# Patient Record
Sex: Female | Born: 1990 | Hispanic: Yes | Marital: Single | Smoking: Former smoker
Health system: Southern US, Community
[De-identification: ages and names within clinical notes are randomized; demographics above are authoritative.]

## PROBLEM LIST (undated history)

## (undated) ENCOUNTER — Inpatient Hospital Stay (HOSPITAL_COMMUNITY): Payer: Self-pay

## (undated) DIAGNOSIS — Z6841 Body Mass Index (BMI) 40.0 and over, adult: Secondary | ICD-10-CM

## (undated) DIAGNOSIS — F419 Anxiety disorder, unspecified: Secondary | ICD-10-CM

## (undated) DIAGNOSIS — F32A Depression, unspecified: Secondary | ICD-10-CM

## (undated) DIAGNOSIS — Z789 Other specified health status: Secondary | ICD-10-CM

## (undated) HISTORY — DX: Anxiety disorder, unspecified: F41.9

## (undated) HISTORY — DX: Depression, unspecified: F32.A

## (undated) HISTORY — DX: Other specified health status: Z78.9

## (undated) HISTORY — DX: Body Mass Index (BMI) 40.0 and over, adult: Z684

## (undated) HISTORY — PX: NO PAST SURGERIES: SHX2092

---
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
## 2019-07-21 LAB — OB RESULTS CONSOLE RPR: RPR: NONREACTIVE

## 2019-07-21 LAB — OB RESULTS CONSOLE ANTIBODY SCREEN: Antibody Screen: NEGATIVE

## 2019-07-21 LAB — OB RESULTS CONSOLE GC/CHLAMYDIA
Chlamydia: NEGATIVE
Gonorrhea: NEGATIVE

## 2019-07-21 LAB — OB RESULTS CONSOLE HIV ANTIBODY (ROUTINE TESTING): HIV: NONREACTIVE

## 2019-07-21 LAB — OB RESULTS CONSOLE ABO/RH: RH Type: POSITIVE

## 2019-07-21 LAB — OB RESULTS CONSOLE RUBELLA ANTIBODY, IGM: Rubella: IMMUNE

## 2019-07-21 LAB — OB RESULTS CONSOLE HEPATITIS B SURFACE ANTIGEN: Hepatitis B Surface Ag: NEGATIVE

## 2019-07-22 ENCOUNTER — Other Ambulatory Visit (HOSPITAL_COMMUNITY): Payer: Self-pay | Admitting: Nurse Practitioner

## 2019-07-22 DIAGNOSIS — Z3A13 13 weeks gestation of pregnancy: Secondary | ICD-10-CM

## 2019-07-22 DIAGNOSIS — Z3682 Encounter for antenatal screening for nuchal translucency: Secondary | ICD-10-CM

## 2019-07-29 ENCOUNTER — Encounter (HOSPITAL_COMMUNITY): Payer: Self-pay | Admitting: *Deleted

## 2019-07-31 ENCOUNTER — Encounter (HOSPITAL_COMMUNITY): Payer: Self-pay

## 2019-07-31 ENCOUNTER — Ambulatory Visit (HOSPITAL_COMMUNITY): Payer: Medicaid Other | Admitting: *Deleted

## 2019-07-31 ENCOUNTER — Ambulatory Visit (HOSPITAL_COMMUNITY)
Admission: RE | Admit: 2019-07-31 | Discharge: 2019-07-31 | Disposition: A | Discharge status: Home / Self Care | Payer: Medicaid Other | Source: Ambulatory Visit | Attending: Obstetrics and Gynecology | Admitting: Obstetrics and Gynecology

## 2019-07-31 ENCOUNTER — Ambulatory Visit (HOSPITAL_COMMUNITY): Payer: Medicaid Other

## 2019-07-31 ENCOUNTER — Other Ambulatory Visit: Payer: Self-pay

## 2019-07-31 VITALS — BP 123/58 | HR 73 | Temp 97.9°F | Ht 64.5 in | Wt 212.5 lb

## 2019-07-31 DIAGNOSIS — O99211 Obesity complicating pregnancy, first trimester: Secondary | ICD-10-CM | POA: Insufficient documentation

## 2019-07-31 DIAGNOSIS — Z3682 Encounter for antenatal screening for nuchal translucency: Secondary | ICD-10-CM | POA: Insufficient documentation

## 2019-07-31 DIAGNOSIS — Z1379 Encounter for other screening for genetic and chromosomal anomalies: Secondary | ICD-10-CM | POA: Insufficient documentation

## 2019-07-31 DIAGNOSIS — Z3A13 13 weeks gestation of pregnancy: Secondary | ICD-10-CM | POA: Insufficient documentation

## 2019-08-02 LAB — FIRST TRIMESTER SCREEN W/NT
CRL: 74 mm
DIA MoM: 0.69
DIA Value: 114.4 pg/mL
Gest Age-Collect: 13.3 weeks
Maternal Age At EDD: 27.2 yr
Nuchal Translucency MoM: 0.82
Nuchal Translucency: 1.7 mm
Number of Fetuses: 1
PAPP-A MoM: 3.19
PAPP-A Value: 2562.5 ng/mL
Test Results:: NEGATIVE
Weight: 212 [lb_av]
hCG MoM: 0.6
hCG Value: 39.4 IU/mL

## 2019-08-04 ENCOUNTER — Telehealth (HOSPITAL_COMMUNITY): Payer: Self-pay | Admitting: Genetic Counselor

## 2019-08-04 NOTE — Telephone Encounter (Signed)
I called Ms. Melanie Ball to discuss her negative first trimester screen results. We reviewed that the risk for her pregnancy to be affected by Down syndrome decreased from her 1 in 870 age-related risk to less than 1 in 10,000, and the risk for trisomy 18 decreased from her 1 in 1703 age-related risk to less than 1 in 10,000 based on the results of this screen. While this screen significantly reduces the likelihood of the pregnancy being affected by trisomy 21 or trisomy 78, it cannot be considered diagnostic. Diagnostic testing via CVS or amniocentesis is available should she be interested in pursuing this. First trimester screening does not screen for open neural tube defects such as spina bifida, so it is recommended that MS-AFP screening be ordered around 16-18 weeks to screen for this. Ms. Melanie Ball confirmed that she had no questions about these results.  Buelah Manis, MS Genetic Counselor

## 2019-08-22 NOTE — L&D Delivery Note (Addendum)
OB/GYN Faculty Practice Delivery Note  Melanie Ball is a 29 y.o. G1P0 s/p SVD at [redacted]w[redacted]d. She was admitted for post-dates IOL.   ROM: 21h 67m with clear fluid GBS Status: Negative Maximum Maternal Temperature: 100.26F  Labor Progress: . Had Foley bulb placed and given 2 doses of Cytotec for cervical ripening.  Started on pitocin.  Had SROM with clear fluid.  After 11 hours on pitocin, was not making significant cervical change.  Therefore, pitocin stopped and given a third dose of Cytotec.  Restarted pitocin.  Progressed to complete without complication.  Shortly after she began pushing, had a fever of 100.4 and was given ampicillin and gentamicin for presumed triple-I.   Delivery Date/Time: 02/09/2020 at 1434 Delivery: Called to room and patient was complete and pushing. Head delivered LOA. No nuchal cord present. Shoulder and body delivered in usual fashion with terminal meconium noted. Infant with spontaneous cry, placed on mother's abdomen, dried and stimulated. Cord clamped x 2 after 1-minute delay, and cut by FOB under my direct supervision. Cord blood drawn. Placenta delivered spontaneously with gentle cord traction. Fundus firm with massage and Pitocin. Labia, perineum, vagina, and cervix were inspected, found to have a 1st degree perineal and right labial lacerations, which were repaired with 2-0 and 3-0 Vicryl Rapide in the usual fashion with hemostasis noted.   Placenta: Intact, 3 vessel cord Complications: Fever 100.4, given ampicillin/gentamicin for presumed triple-I Lacerations: 1st degree perineal and right labial EBL: 350 cc Analgesia: Epidural   Infant: Viable female  APGARs 8/9  per medical record  EMILY Genene Churn, MD PGY-2 Resident Family Medicine 02/09/2020, 3:14 PM   OB FELLOW DELIVERY ATTESTATION  I was gloved and present for the delivery in its entirety, and I agree with the above resident's note.    Jerilynn Birkenhead, MD [facility] Family Medicine Fellow, [facility] for [employer], [facility]

## 2019-09-12 ENCOUNTER — Encounter: Payer: Self-pay | Admitting: Medical

## 2019-09-12 DIAGNOSIS — U071 COVID-19: Secondary | ICD-10-CM | POA: Insufficient documentation

## 2020-01-06 LAB — OB RESULTS CONSOLE GC/CHLAMYDIA
Chlamydia: NEGATIVE
Gonorrhea: NEGATIVE

## 2020-01-06 LAB — OB RESULTS CONSOLE GBS: GBS: NEGATIVE

## 2020-02-02 ENCOUNTER — Other Ambulatory Visit (HOSPITAL_COMMUNITY): Payer: Self-pay | Admitting: Advanced Practice Midwife

## 2020-02-02 ENCOUNTER — Encounter (HOSPITAL_COMMUNITY): Payer: Self-pay | Admitting: *Deleted

## 2020-02-02 ENCOUNTER — Other Ambulatory Visit: Payer: Self-pay | Admitting: Family Medicine

## 2020-02-02 ENCOUNTER — Telehealth (HOSPITAL_COMMUNITY): Payer: Self-pay | Admitting: *Deleted

## 2020-02-02 NOTE — Telephone Encounter (Signed)
Preadmission screen  

## 2020-02-02 NOTE — Progress Notes (Signed)
Induction orders placed  Marlowe Alt, DO OB Fellow, Faculty Practice 02/02/2020 4:21 PM

## 2020-02-06 ENCOUNTER — Other Ambulatory Visit (HOSPITAL_COMMUNITY): Payer: Medicaid Other | Attending: Family Medicine

## 2020-02-08 ENCOUNTER — Inpatient Hospital Stay (HOSPITAL_COMMUNITY): Payer: Medicaid Other | Admitting: Anesthesiology

## 2020-02-08 ENCOUNTER — Other Ambulatory Visit: Payer: Self-pay

## 2020-02-08 ENCOUNTER — Inpatient Hospital Stay (HOSPITAL_COMMUNITY)
Admission: RE | Admit: 2020-02-08 | Discharge: 2020-02-11 | DRG: 805 | Disposition: A | Discharge status: Home / Self Care | Payer: Medicaid Other | Attending: Obstetrics & Gynecology | Admitting: Obstetrics & Gynecology

## 2020-02-08 ENCOUNTER — Inpatient Hospital Stay (HOSPITAL_COMMUNITY): Payer: Medicaid Other

## 2020-02-08 ENCOUNTER — Encounter (HOSPITAL_COMMUNITY): Payer: Self-pay | Admitting: Obstetrics and Gynecology

## 2020-02-08 DIAGNOSIS — Z3A41 41 weeks gestation of pregnancy: Secondary | ICD-10-CM

## 2020-02-08 DIAGNOSIS — Z8616 Personal history of COVID-19: Secondary | ICD-10-CM

## 2020-02-08 DIAGNOSIS — O480 Post-term pregnancy: Secondary | ICD-10-CM | POA: Diagnosis present

## 2020-02-08 DIAGNOSIS — O411230 Chorioamnionitis, third trimester, not applicable or unspecified: Secondary | ICD-10-CM | POA: Diagnosis present

## 2020-02-08 DIAGNOSIS — O99210 Obesity complicating pregnancy, unspecified trimester: Secondary | ICD-10-CM

## 2020-02-08 DIAGNOSIS — Z20822 Contact with and (suspected) exposure to covid-19: Secondary | ICD-10-CM | POA: Diagnosis present

## 2020-02-08 DIAGNOSIS — O99214 Obesity complicating childbirth: Secondary | ICD-10-CM | POA: Diagnosis present

## 2020-02-08 DIAGNOSIS — E669 Obesity, unspecified: Secondary | ICD-10-CM

## 2020-02-08 DIAGNOSIS — Z87891 Personal history of nicotine dependence: Secondary | ICD-10-CM

## 2020-02-08 DIAGNOSIS — O411290 Chorioamnionitis, unspecified trimester, not applicable or unspecified: Secondary | ICD-10-CM

## 2020-02-08 DIAGNOSIS — Z3490 Encounter for supervision of normal pregnancy, unspecified, unspecified trimester: Secondary | ICD-10-CM | POA: Diagnosis present

## 2020-02-08 LAB — CBC
HCT: 37.2 % (ref 36.0–46.0)
Hemoglobin: 12.4 g/dL (ref 12.0–15.0)
MCH: 32.3 pg (ref 26.0–34.0)
MCHC: 33.3 g/dL (ref 30.0–36.0)
MCV: 96.9 fL (ref 80.0–100.0)
Platelets: 222 10*3/uL (ref 150–400)
RBC: 3.84 MIL/uL — ABNORMAL LOW (ref 3.87–5.11)
RDW: 13.8 % (ref 11.5–15.5)
WBC: 8.3 10*3/uL (ref 4.0–10.5)
nRBC: 0 % (ref 0.0–0.2)

## 2020-02-08 LAB — TYPE AND SCREEN
ABO/RH(D): O POS
Antibody Screen: NEGATIVE

## 2020-02-08 LAB — SARS CORONAVIRUS 2 BY RT PCR (HOSPITAL ORDER, PERFORMED IN ~~LOC~~ HOSPITAL LAB): SARS Coronavirus 2: NEGATIVE

## 2020-02-08 LAB — ABO/RH: ABO/RH(D): O POS

## 2020-02-08 LAB — RPR: RPR Ser Ql: NONREACTIVE

## 2020-02-08 MED ORDER — DIPHENHYDRAMINE HCL 50 MG/ML IJ SOLN
12.5000 mg | INTRAMUSCULAR | Status: DC | PRN
  Administered 2020-02-09: 12.5 mg via INTRAVENOUS
  Filled 2020-02-08: qty 1

## 2020-02-08 MED ORDER — FENTANYL CITRATE (PF) 100 MCG/2ML IJ SOLN
50.0000 ug | INTRAMUSCULAR | Status: DC | PRN
  Administered 2020-02-08 (×3): 100 ug via INTRAVENOUS
  Filled 2020-02-08 (×3): qty 2

## 2020-02-08 MED ORDER — TERBUTALINE SULFATE 1 MG/ML IJ SOLN: 0.2500 mg | Freq: Once | INTRAMUSCULAR | Status: DC | PRN

## 2020-02-08 MED ORDER — LACTATED RINGERS IV SOLN
500.0000 mL | Freq: Once | INTRAVENOUS | Status: AC
  Administered 2020-02-08: 500 mL via INTRAVENOUS

## 2020-02-08 MED ORDER — OXYTOCIN-SODIUM CHLORIDE 30-0.9 UT/500ML-% IV SOLN
1.0000 m[IU]/min | INTRAVENOUS | Status: DC
  Administered 2020-02-08: 2 m[IU]/min via INTRAVENOUS

## 2020-02-08 MED ORDER — OXYTOCIN BOLUS FROM INFUSION
500.0000 mL | Freq: Once | INTRAVENOUS | Status: AC
  Administered 2020-02-09: 333 mL via INTRAVENOUS

## 2020-02-08 MED ORDER — FENTANYL-BUPIVACAINE-NACL 0.5-0.125-0.9 MG/250ML-% EP SOLN
12.0000 mL/h | EPIDURAL | Status: DC | PRN
  Filled 2020-02-08: qty 250

## 2020-02-08 MED ORDER — FENTANYL-BUPIVACAINE-NACL 0.5-0.125-0.9 MG/250ML-% EP SOLN
12.0000 mL/h | EPIDURAL | Status: DC | PRN
Start: 2020-02-08 — End: 2020-02-08

## 2020-02-08 MED ORDER — LACTATED RINGERS IV SOLN: 500.0000 mL | Freq: Once | INTRAVENOUS | Status: DC

## 2020-02-08 MED ORDER — MISOPROSTOL 50MCG HALF TABLET
50.0000 ug | ORAL_TABLET | ORAL | Status: DC | PRN
  Administered 2020-02-08 (×3): 50 ug via ORAL
  Filled 2020-02-08 (×3): qty 1

## 2020-02-08 MED ORDER — PHENYLEPHRINE 40 MCG/ML (10ML) SYRINGE FOR IV PUSH (FOR BLOOD PRESSURE SUPPORT)
80.0000 ug | PREFILLED_SYRINGE | INTRAVENOUS | Status: DC | PRN
  Filled 2020-02-08: qty 10

## 2020-02-08 MED ORDER — EPHEDRINE 5 MG/ML INJ
10.0000 mg | INTRAVENOUS | Status: DC | PRN
  Filled 2020-02-08: qty 2

## 2020-02-08 MED ORDER — ONDANSETRON HCL 4 MG/2ML IJ SOLN
4.0000 mg | Freq: Four times a day (QID) | INTRAMUSCULAR | Status: DC | PRN
  Administered 2020-02-08: 4 mg via INTRAVENOUS
  Filled 2020-02-08: qty 2

## 2020-02-08 MED ORDER — SOD CITRATE-CITRIC ACID 500-334 MG/5ML PO SOLN: 30.0000 mL | ORAL | Status: DC | PRN

## 2020-02-08 MED ORDER — LIDOCAINE HCL (PF) 1 % IJ SOLN
INTRAMUSCULAR | Status: DC | PRN
  Administered 2020-02-08: 5 mL via EPIDURAL

## 2020-02-08 MED ORDER — EPHEDRINE 5 MG/ML INJ: 10.0000 mg | INTRAVENOUS | Status: DC | PRN

## 2020-02-08 MED ORDER — LACTATED RINGERS IV SOLN: INTRAVENOUS | Status: DC

## 2020-02-08 MED ORDER — OXYTOCIN-SODIUM CHLORIDE 30-0.9 UT/500ML-% IV SOLN
2.5000 [IU]/h | INTRAVENOUS | Status: DC
  Administered 2020-02-09: 2.5 [IU]/h via INTRAVENOUS
  Filled 2020-02-08 (×2): qty 500

## 2020-02-08 MED ORDER — PHENYLEPHRINE 40 MCG/ML (10ML) SYRINGE FOR IV PUSH (FOR BLOOD PRESSURE SUPPORT): 80.0000 ug | PREFILLED_SYRINGE | INTRAVENOUS | Status: DC | PRN

## 2020-02-08 MED ORDER — LACTATED RINGERS IV SOLN: 500.0000 mL | INTRAVENOUS | Status: DC | PRN

## 2020-02-08 MED ORDER — LIDOCAINE HCL (PF) 1 % IJ SOLN: 30.0000 mL | INTRAMUSCULAR | Status: DC | PRN

## 2020-02-08 MED ORDER — SODIUM CHLORIDE (PF) 0.9 % IJ SOLN
INTRAMUSCULAR | Status: DC | PRN
  Administered 2020-02-08: 12 mL/h via EPIDURAL

## 2020-02-08 MED ORDER — ACETAMINOPHEN 325 MG PO TABS
650.0000 mg | ORAL_TABLET | ORAL | Status: DC | PRN
  Administered 2020-02-09 (×2): 650 mg via ORAL
  Filled 2020-02-08 (×2): qty 2

## 2020-02-08 NOTE — Progress Notes (Signed)
Patient ID: Melanie Ball, female   DOB: [birth year], 29 y.o.   MRN: [id number]  Melanie Ball is a 29 y.o. G1P0 at [redacted]w[redacted]d admitted for IOL for post-dates.  Subjective: Feeling some contractions.  Agreeable to starting pitocin.  Objective: BP 121/67   Pulse 74   Temp 98 F (36.7 C) (Oral)   Resp 16   Ht 5\' 5"  (1.651 m)   Wt 113.2 kg   LMP 04/27/2019   BMI 41.53 kg/m  Total I/O In: 854 [I.V.:854] Out: -   FHT:  FHR: 125 bpm, variability: moderate,  accelerations:  Present,  decelerations:  Absent UC:   irregular, every 3-6 minutes  SVE:   Dilation: 3.5 Effacement (%): 80, 90 Station: -2 Exam by:: Dr. [email or web address]   Labs: Lab Results  Component Value Date   WBC 8.3 02/08/2020   HGB 12.4 02/08/2020   HCT 37.2 02/08/2020   MCV 96.9 02/08/2020   PLT 222 02/08/2020    Assessment / Plan: 29 y.o. G1P0 at [redacted]w[redacted]d here for IOL for post-dates.  Labor: S/p miso x2 and Foley bulb.  Start pitocin. Fetal Wellbeing:  Cat I Pain Control:  IV pain meds PRN, epidural upon request GBS: Negative Anticipated MOD:  SVD   Melanie Ball [redacted]w[redacted]d, MD PGY-2 Resident Family Medicine 02/08/2020, 10:54 AM

## 2020-02-08 NOTE — H&P (Signed)
LABOR AND DELIVERY ADMISSION HISTORY AND PHYSICAL NOTE  Melanie Ball is a 29 y.o. female G1P0 with IUP at [redacted]w[redacted]d by LMP c/w 13wk [place] presenting for post dates IOL.   She reports positive fetal movement. She denies leakage of fluid, vaginal bleeding, or contractions.   She plans on breast feeding. Her contraception plan is: condoms.  Prenatal History/Complications: PNC at [facility] [place]:  @[redacted]w[redacted]d , CWD, normal anatomy, cephalic presentation, posterior placenta, 59%ile, EFW 504g  Pregnancy complications:  - None  Past Medical History: Past Medical History:  Diagnosis Date  . Medical history non-contributory     Past Surgical History: Past Surgical History:  Procedure Laterality Date  . NO PAST SURGERIES      Obstetrical History: OB History    Gravida  1   Para      Term      Preterm      AB      Living  0     SAB      TAB      Ectopic      Multiple      Live Births              Social History: Social History   Socioeconomic History  . Marital status: Single    Spouse name: Not on file  . Number of children: Not on file  . Years of education: Not on file  . Highest education level: Not on file  Occupational History  . Not on file  Tobacco Use  . Smoking status: Former Smoker    Packs/day: 0.25    Types: Cigarettes    Quit date: 02/28/2019    Years since quitting: 0.9  . Smokeless tobacco: Never Used  Substance and Sexual Activity  . Alcohol use: Not Currently  . Drug use: Not Currently  . Sexual activity: Not on file  Other Topics Concern  . Not on file  Social History Narrative  . Not on file   Social Determinants of Health   Financial Resource Strain:   . Difficulty of Paying Living Expenses:   Food Insecurity:   . Worried About 05/01/2019 in the Last Year:   . Programme researcher, broadcasting/film/video in the Last Year:   Transportation Needs:   . Barista (Medical):   Freight forwarder Lack of Transportation (Non-Medical):   Physical Activity:    . Days of Exercise per Week:   . Minutes of Exercise per Session:   Stress:   . Feeling of Stress :   Social Connections:   . Frequency of Communication with Friends and Family:   . Frequency of Social Gatherings with Friends and Family:   . Attends Religious Services:   . Active Member of Clubs or Organizations:   . Attends [employer] Meetings:   Banker Marital Status:     Family History: No family history on file.  Allergies: No Known Allergies  Medications Prior to Admission  Medication Sig Dispense Refill Last Dose  . Prenatal Vit-Fe Fumarate-FA (PRENATAL VITAMIN PO) Take by mouth.        Review of Systems  All systems reviewed and negative except as stated in HPI  Physical Exam Weight 113.2 kg, last menstrual period 04/27/2019. General appearance: alert, oriented, NAD Lungs: normal respiratory effort Heart: regular rate Abdomen: soft, non-tender; gravid Extremities: No calf swelling or tenderness Presentation: cephalic by SVE  Fetal monitoringBaseline: 120 bpm, Variability: Good {> 6 bpm), Accelerations: Reactive and Decelerations: Absent Uterine activity:  irregular     Prenatal labs: ABO, Rh: O/Positive/-- (11/30 0000) Antibody: Negative (11/30 0000) Rubella: Immune (11/30 0000) RPR: Nonreactive (11/30 0000)  HBsAg: Negative (11/30 0000)  HIV: Non-reactive (11/30 0000)  GC/Chlamydia: neg/neg 01/06/2020  GBS: Negative/-- (05/18 0000)  1-hr GTT: abnormal 146, 3hr GTT normal (51,025,852,77) Genetic screening:  Normal first trimester screen Anatomy US: normal  Prenatal Transfer Tool  Maternal Diabetes: No Genetic Screening: Normal Maternal Ultrasounds/Referrals: Normal Fetal Ultrasounds or other Referrals:  None Maternal Substance Abuse:  No Significant Maternal Medications:  None Significant Maternal Lab Results: Group B Strep negative  No results found for this or any previous visit (from the past 24 hour(s)).  Patient Active Problem List    Diagnosis Date Noted  . Encounter for induction of labor 02/08/2020  . COVID-19 09/12/2019    Assessment: Melanie Ball is a 29 y.o. G1P0 at [redacted]w[redacted]d here for post dates IOL.  #Labor: start induction with misoprostol #Pain: IV pain meds PRN, epidural upon request #FWB: Cat I #GBS/ID: negative #COVID: prior positive on 09/11/2019, per protocol repeat obtained and is pending #MOF: Breast #MOC: Condoms #Circ: yes   Clarnce Flock 02/08/2020, 12:14 AM

## 2020-02-08 NOTE — Anesthesia Preprocedure Evaluation (Addendum)
Anesthesia Evaluation  Patient identified by MRN, date of birth, ID band Patient awake    Reviewed: Allergy & Precautions, Patient's Chart, lab work & pertinent test results  Airway Mallampati: II       Dental   Pulmonary former smoker,    Pulmonary exam normal        Cardiovascular negative cardio ROS Normal cardiovascular exam     Neuro/Psych negative neurological ROS  negative psych ROS   GI/Hepatic   Endo/Other    Renal/GU      Musculoskeletal   Abdominal (+) + obese,   Peds  Hematology   Anesthesia Other Findings   Reproductive/Obstetrics (+) Pregnancy                            Anesthesia Physical Anesthesia Plan  ASA: III  Anesthesia Plan: Epidural   Post-op Pain Management:    Induction:   PONV Risk Score and Plan: 0  Airway Management Planned: Natural Airway  Additional Equipment: None  Intra-op Plan:   Post-operative Plan:   Informed Consent: I have reviewed the patients History and Physical, chart, labs and discussed the procedure including the risks, benefits and alternatives for the proposed anesthesia with the patient or authorized representative who has indicated his/her understanding and acceptance.       Plan Discussed with:   Anesthesia Plan Comments: (Lab Results      Component                Value               Date                      WBC                      8.3                 02/08/2020                HGB                      12.4                02/08/2020                HCT                      37.2                02/08/2020                MCV                      96.9                02/08/2020                PLT                      222                 02/08/2020           )       Anesthesia Quick Evaluation

## 2020-02-08 NOTE — Progress Notes (Signed)
MVUs <40  IUPC re-zeroed and flushed multiple times.  Patient has been ~5 cm since 4 PM.  Will do pitocin break at this time. Give 2nd cytotec (cervix still firm on last exam)  Restart pit in 4 hours. FHR Cat I at this time  Marlowe Alt, DO OB Fellow, Faculty Practice 02/08/2020 11:59 PM

## 2020-02-08 NOTE — Progress Notes (Signed)
Melanie Ball is a 29 y.o. G1P0 at [redacted]w[redacted]d admitted for IOL for post-dates.  Subjective: Comfortable with epidural  Objective: BP 121/63   Pulse 81   Temp 99.1 F (37.3 C) (Oral)   Resp 15   Ht 5\' 5"  (1.651 m)   Wt 113.2 kg   LMP 04/27/2019   SpO2 100%   BMI 41.53 kg/m  No intake/output data recorded.  FHT:  FHR: 135 bpm, variability: moderate,  accelerations:  Present,  decelerations:  Absent UC:   Difficult to trace on TOCO  SVE:   Dilation: 5 Effacement (%): 90 Station: 0 Exam by:: [email or web address]  Pitocin @ 18 mu/min  Labs: Lab Results  Component Value Date   WBC 8.3 02/08/2020   HGB 12.4 02/08/2020   HCT 37.2 02/08/2020   MCV 96.9 02/08/2020   PLT 222 02/08/2020    Assessment / Plan: 29 y.o.G1P0 at [redacted]w[redacted]d here forIOL for post-dates.  Labor:S/p cyto x2 and Foley bulb.SROM with clear fluid. Pitocin started at 1051 this AM. IUPC placed at this check due to difficulty tracing contractions, will continue to titrate. Fetal Wellbeing:Cat I Pain Control:Epidural [redacted]w[redacted]d Anticipated MOD:SVD  XNA:[id number] DO OB Fellow, Faculty Practice 02/08/2020, 10:25 PM

## 2020-02-08 NOTE — Progress Notes (Signed)
LABOR PROGRESS NOTE  Melanie Ball is a 29 y.o. G1P0 at [redacted]w[redacted]d  admitted for PD IOL.  Subjective: Comfortable Feeling contractions a bit  Objective: BP 116/68   Pulse 67   Temp 98.2 F (36.8 C) (Oral)   Resp 16   Ht 5\' 5"  (1.651 m)   Wt 113.2 kg   LMP 04/27/2019   BMI 41.53 kg/m  or  Vitals:   02/08/20 0037 02/08/20 0146 02/08/20 0149 02/08/20 0512  BP: 125/69  123/75 116/68  Pulse: 88  82 67  Resp: 16  16 16   Temp: 98.2 F (36.8 C)   98.2 F (36.8 C)  TempSrc: Oral   Oral  Weight:      Height:  5\' 5"  (1.651 m)       Dilation: 1 Effacement (%): 70 Station: -2 Presentation: Vertex Exam by:: Dr FHT: baseline rate 120, moderate varibility, +acel, -decel Toco: irregular  Labs: Lab Results  Component Value Date   WBC 8.3 02/08/2020   HGB 12.4 02/08/2020   HCT 37.2 02/08/2020   MCV 96.9 02/08/2020   PLT 222 02/08/2020    Patient Active Problem List   Diagnosis Date Noted  . Encounter for induction of labor 02/08/2020  . COVID-19 09/12/2019    Assessment / Plan: 29 y.o. G1P0 at [redacted]w[redacted]d here for PD IOL.  Labor: s/p miso x1, amenable to foley on this check. FB placed with ~50cc of fluid, patient unable to tolerate more. Will give second miso as well.  Fetal Wellbeing:  Cat I Pain Control:  IV pain meds PRN, epidural upon request GBS: neg Anticipated MOD:  SVD   09/14/2019, MD/MPH OB Fellow  02/08/2020, 5:51 AM

## 2020-02-08 NOTE — Progress Notes (Signed)
Patient ID: Melanie Ball, female   DOB: 05/18/1991, 29 y.o.   MRN: [id number]  Melanie Ball is a 29 y.o. G1P0 at [redacted]w[redacted]d admitted for IOL for post-dates.  Subjective: Feeling more intense contractions.  Objective: BP 127/69   Pulse 92   Temp 98.8 F (37.1 C) (Oral)   Resp 15   Ht 5\' 5"  (1.651 m)   Wt 113.2 kg   LMP 04/27/2019   BMI 41.53 kg/m  No intake/output data recorded.  FHT:  FHR: 125 bpm, variability: moderate,  accelerations:  Present,  decelerations:  Absent UC:   regular, every 2 minutes  SVE:   Dilation: 5 Effacement (%): 90 Station: -2 Exam by:: Dr. [email or web address]  Pitocin @ 16 mu/min  Labs: Lab Results  Component Value Date   WBC 8.3 02/08/2020   HGB 12.4 02/08/2020   HCT 37.2 02/08/2020   MCV 96.9 02/08/2020   PLT 222 02/08/2020    Assessment / Plan: 29 y.o.G1P0 at [redacted]w[redacted]d here forIOL for post-dates.  Labor:S/p miso x2 and Foley bulb. SROM with clear fluid.  Continue pitocin.   Fetal Wellbeing:Cat I Pain Control:IV pain meds PRN, epidural upon request [redacted]w[redacted]d Anticipated MOD:SVD   Rachel Rison CBU:[id number], MD PGY-2 Resident Family Medicine 02/08/2020, 7:38 PM

## 2020-02-08 NOTE — Progress Notes (Signed)
Patient ID: Melanie Ball, female   DOB: November 15, 1990, 29 y.o.   MRN: [id number]  Melanie Ball is a 29 y.o. G1P0 at [redacted]w[redacted]d admitted for IOL for post-dates.  Subjective: Feeling contractions, but able to sleep through them.  Objective: BP 111/69   Pulse 67   Temp 98.6 F (37 C) (Oral)   Resp 16   Ht 5\' 5"  (1.651 m)   Wt 113.2 kg   LMP 04/27/2019   BMI 41.53 kg/m  Total I/O In: 1350.5 [I.V.:1350.5] Out: -   FHT:  FHR: 130 bpm, variability: moderate,  accelerations:  Present,  decelerations:  Absent UC:   Irregular  SVE:   Dilation: 4.5 Effacement (%): 90 Station: -2 Exam by:: Dr. [email or web address]  Pitocin @ 10 mu/min  Labs: Lab Results  Component Value Date   WBC 8.3 02/08/2020   HGB 12.4 02/08/2020   HCT 37.2 02/08/2020   MCV 96.9 02/08/2020   PLT 222 02/08/2020    Assessment / Plan: 29 y.o.G1P0 at [redacted]w[redacted]d here for IOL for post-dates.  Labor:S/p miso x2 and Foley bulb.  Continue pitocin.  Likely AROM next check. Fetal Wellbeing:Cat I Pain Control:IV pain meds PRN, epidural upon request [redacted]w[redacted]d Anticipated MOD:SVD   Tonna Palazzi KSY:[id number], MD PGY-2 Resident Family Medicine 02/08/2020, 3:48 PM

## 2020-02-08 NOTE — Anesthesia Procedure Notes (Signed)
Epidural Patient location during procedure: OB Start time: 02/08/2020 8:58 PM End time: 02/08/2020 9:05 PM  Staffing Anesthesiologist: Shelton Silvas, MD Performed: anesthesiologist   Preanesthetic Checklist Completed: patient identified, IV checked, site marked, risks and benefits discussed, surgical consent, monitors and equipment checked, pre-op evaluation and timeout performed  Epidural Patient position: sitting Prep: ChloraPrep Patient monitoring: heart rate, continuous pulse ox and blood pressure Approach: midline Location: L3-L4 Injection technique: LOR saline  Needle:  Needle type: Tuohy  Needle gauge: 17 G Needle length: 9 cm Catheter type: closed end flexible Catheter size: 20 Guage Test dose: negative and 1.5% lidocaine  Assessment Events: blood not aspirated, injection not painful, no injection resistance and no paresthesia  Additional Notes LOR @ 7  Patient identified. Risks/Benefits/Options discussed with patient including but not limited to bleeding, infection, nerve damage, paralysis, failed block, incomplete pain control, headache, blood pressure changes, nausea, vomiting, reactions to medications, itching and postpartum back pain. Confirmed with bedside nurse the patient's most recent platelet count. Confirmed with patient that they are not currently taking any anticoagulation, have any bleeding history or any family history of bleeding disorders. Patient expressed understanding and wished to proceed. All questions were answered. Sterile technique was used throughout the entire procedure. Please see nursing notes for vital signs. Test dose was given through epidural catheter and negative prior to continuing to dose epidural or start infusion. Warning signs of high block given to the patient including shortness of breath, tingling/numbness in hands, complete motor block, or any concerning symptoms with instructions to call for help. Patient was given instructions on  fall risk and not to get out of bed. All questions and concerns addressed with instructions to call with any issues or inadequate analgesia.    Reason for block:procedure for pain

## 2020-02-09 ENCOUNTER — Encounter (HOSPITAL_COMMUNITY): Payer: Self-pay | Admitting: Obstetrics and Gynecology

## 2020-02-09 DIAGNOSIS — O99210 Obesity complicating pregnancy, unspecified trimester: Secondary | ICD-10-CM

## 2020-02-09 DIAGNOSIS — O411290 Chorioamnionitis, unspecified trimester, not applicable or unspecified: Secondary | ICD-10-CM

## 2020-02-09 DIAGNOSIS — O480 Post-term pregnancy: Secondary | ICD-10-CM

## 2020-02-09 DIAGNOSIS — O411230 Chorioamnionitis, third trimester, not applicable or unspecified: Secondary | ICD-10-CM

## 2020-02-09 DIAGNOSIS — Z3A41 41 weeks gestation of pregnancy: Secondary | ICD-10-CM

## 2020-02-09 DIAGNOSIS — E669 Obesity, unspecified: Secondary | ICD-10-CM

## 2020-02-09 MED ORDER — WITCH HAZEL-GLYCERIN EX PADS: 1.0000 "application " | MEDICATED_PAD | CUTANEOUS | Status: DC | PRN

## 2020-02-09 MED ORDER — SIMETHICONE 80 MG PO CHEW: 80.0000 mg | CHEWABLE_TABLET | ORAL | Status: DC | PRN

## 2020-02-09 MED ORDER — PRENATAL MULTIVITAMIN CH
1.0000 | ORAL_TABLET | Freq: Every day | ORAL | Status: DC
  Administered 2020-02-10 – 2020-02-11 (×2): 1 via ORAL
  Filled 2020-02-09 (×2): qty 1

## 2020-02-09 MED ORDER — SODIUM CHLORIDE 0.9 % IV SOLN
2.0000 g | Freq: Four times a day (QID) | INTRAVENOUS | Status: DC
  Administered 2020-02-09: 2 g via INTRAVENOUS
  Filled 2020-02-09: qty 2000

## 2020-02-09 MED ORDER — GENTAMICIN SULFATE 40 MG/ML IJ SOLN
5.0000 mg/kg | INTRAVENOUS | Status: DC
  Administered 2020-02-09: 400 mg via INTRAVENOUS
  Filled 2020-02-09: qty 10

## 2020-02-09 MED ORDER — TETANUS-DIPHTH-ACELL PERTUSSIS 5-2.5-18.5 LF-MCG/0.5 IM SUSP: 0.5000 mL | Freq: Once | INTRAMUSCULAR | Status: DC

## 2020-02-09 MED ORDER — IBUPROFEN 600 MG PO TABS
600.0000 mg | ORAL_TABLET | Freq: Three times a day (TID) | ORAL | Status: DC | PRN
  Administered 2020-02-09 – 2020-02-10 (×2): 600 mg via ORAL
  Filled 2020-02-09 (×2): qty 1

## 2020-02-09 MED ORDER — ACETAMINOPHEN 325 MG PO TABS: 650.0000 mg | ORAL_TABLET | Freq: Four times a day (QID) | ORAL | Status: DC | PRN

## 2020-02-09 MED ORDER — DIBUCAINE (PERIANAL) 1 % EX OINT: 1.0000 "application " | TOPICAL_OINTMENT | CUTANEOUS | Status: DC | PRN

## 2020-02-09 MED ORDER — MEASLES, MUMPS & RUBELLA VAC IJ SOLR: 0.5000 mL | Freq: Once | INTRAMUSCULAR | Status: DC

## 2020-02-09 MED ORDER — DIPHENHYDRAMINE HCL 25 MG PO CAPS: 25.0000 mg | ORAL_CAPSULE | Freq: Four times a day (QID) | ORAL | Status: DC | PRN

## 2020-02-09 MED ORDER — ONDANSETRON HCL 4 MG PO TABS: 4.0000 mg | ORAL_TABLET | ORAL | Status: DC | PRN

## 2020-02-09 MED ORDER — ONDANSETRON HCL 4 MG/2ML IJ SOLN: 4.0000 mg | INTRAMUSCULAR | Status: DC | PRN

## 2020-02-09 MED ORDER — COCONUT OIL OIL
1.0000 "application " | TOPICAL_OIL | Status: DC | PRN
  Administered 2020-02-10: 1 via TOPICAL

## 2020-02-09 MED ORDER — SENNOSIDES-DOCUSATE SODIUM 8.6-50 MG PO TABS
2.0000 | ORAL_TABLET | ORAL | Status: DC
  Administered 2020-02-10 (×2): 2 via ORAL
  Filled 2020-02-09 (×2): qty 2

## 2020-02-09 MED ORDER — BENZOCAINE-MENTHOL 20-0.5 % EX AERO
1.0000 "application " | INHALATION_SPRAY | CUTANEOUS | Status: DC | PRN
  Administered 2020-02-10 – 2020-02-11 (×2): 1 via TOPICAL
  Filled 2020-02-09 (×2): qty 56

## 2020-02-09 NOTE — Progress Notes (Signed)
Patient ID: Melanie Ball, female   DOB: 1991-07-16, 29 y.o.   MRN: [id number]  Melanie Ball is a 29 y.o. G1P0 at [redacted]w[redacted]d admitted for IOL for post-dates.  Subjective: Feeling more pressure.  Objective: BP 106/61   Pulse 98   Temp 98.2 F (36.8 C) (Oral)   Resp 17   Ht 5\' 5"  (1.651 m)   Wt 113.2 kg   LMP 04/27/2019   SpO2 100%   BMI 41.53 kg/m  Total I/O In: -  Out: 200 [Urine:200]  FHT:  FHR: 145 bpm, variability: moderate,  accelerations:  Abscent,  decelerations:  Present occasional variables UC:   regular, every 2 minutes  SVE:   Dilation: 10 Effacement (%): 100 Station: 0 Exam by:: stone rnc  Pitocin @ 22 mu/min  Labs: Lab Results  Component Value Date   WBC 8.3 02/08/2020   HGB 12.4 02/08/2020   HCT 37.2 02/08/2020   MCV 96.9 02/08/2020   PLT 222 02/08/2020    Assessment / Plan: 29 y.o.G1P0 at [redacted]w[redacted]d here forIOL for post-dates.  Labor:S/pcytox3and Foley bulb.SROM with clear fluid.Pitocin started at [redacted]w[redacted]d, with 3rd cyto given during pit break.  IUPC in place.  Continue pitocin.  Complete, but 0 station.  Placed in high Fowlers and will labor down.  Fetal Wellbeing:Cat I Pain Control:Epidural [phone] Anticipated MOD:SVD   Chiquitta Matty KCL:[id number], MD PGY-2 Resident Family Medicine 02/09/2020, 12:05 PM

## 2020-02-09 NOTE — Progress Notes (Signed)
Idella Lamontagne is a 29 y.o. G1P0 at [redacted]w[redacted]d admitted for IOL for post-dates.  Subjective:   Objective: BP 121/71   Pulse 95   Temp 98.8 F (37.1 C) (Oral)   Resp 16   Ht 5\' 5"  (1.651 m)   Wt 113.2 kg   LMP 04/27/2019   SpO2 100%   BMI 41.53 kg/m  No intake/output data recorded.  FHT:  FHR: 150 bpm, variability: moderate,  accelerations:  Present,  decelerations:  Absent UC:   regular, every 4-5 minutes  SVE:   Dilation: 6.5 Effacement (%): 90 Station: 0 Exam by:: [email or web address] RN  Labs: Lab Results  Component Value Date   WBC 8.3 02/08/2020   HGB 12.4 02/08/2020   HCT 37.2 02/08/2020   MCV 96.9 02/08/2020   PLT 222 02/08/2020    Assessment / Plan: 29 y.o.G1P0 at [redacted]w[redacted]d here forIOL for post-dates.  Labor:S/p cyto x3 and Foley bulb.SROM with clear fluid.Pitocin started at [redacted]w[redacted]d, with 3rd cyto given during pit break. Restart pitocin, use IUPC to titrate. Fetal Wellbeing:Cat I Pain Control:Epidural [phone] Anticipated MOD:SVD  FMB:[id number] Kesler Wickham DO OB Fellow, Faculty Practice 02/09/2020, 4:43 AM

## 2020-02-09 NOTE — Lactation Note (Signed)
This note was copied from a baby's chart. Lactation Consultation Note Baby 8 hrs old. Mom had called LC for feeding assistance. Baby sleeping. Encouraged FOB to un-swaddle, baby had 1 regular blanket and 1 thick blanket. Explained to parents baby doesn't need all of that cover as warm as it is.  Mom had breast out stating she couldn't get any milk out and the baby needs milk.  Mom has short shaft everted nipples. Breast heavy e/edema. Reverse pressure to areola to soften breast tissue for easily latching. Hand expression w/colostrum noted after much massage and hand expression. Explained to mom that her breast has swelling making the flow of colostrum not run out of breast like she feels it should and the consistency of the colostrum is thicker than regular milk.  Previous LC gave mom #24 NS. Asked mom to apply after this LC wet NS and gave to mom and demonstrated. Mom applied.  LC tried to wake baby stimulating to feed. W/gloved finger attempted to get baby to suckle but wouldn't, just cried. When stopped crying placed at breast. Baby awake looking at mom, would cue or or mouth. Had no interest in BF. Shells given to mom to wear to help w/edema. Mom has DEBP at bedside.  Swaddled baby placed in bassinet, LC reviewing and educating, noted baby spit up. Mom telling FOB how to use bulb syring. LC went over to baby clear mouth, baby was choking, LC turned baby over, patting on back, using bulb syring to clear mouth. Baby having a lot of clear water emesis.  RN notified. Baby still having emesis, crying and spitting. Baby calmed down but crying. Gave baby to mom to hold upright. Bulb syring given to mom.  Baby ok when LC left room.  Patient Name: Melanie Ball KGYJE'H Date: 02/09/2020 Reason for consult: Mother's request;Difficult latch;Primapara;Term   Maternal Data Formula Feeding for Exclusion: No Has patient been taught Hand Expression?: Yes Does the patient have breastfeeding  experience prior to this delivery?: No  Feeding Feeding Type: Breast Fed  LATCH Score Latch: Too sleepy or reluctant, no latch achieved, no sucking elicited.  Audible Swallowing: None  Type of Nipple: Everted at rest and after stimulation (short shaft/edema)  Comfort (Breast/Nipple): Filling, red/small blisters or bruises, mild/mod discomfort (edema)  Hold (Positioning): Full assist, staff holds infant at breast  LATCH Score: 3  Interventions Interventions: Breast feeding basics reviewed;Support pillows;Assisted with latch;Position options;Skin to skin;Breast massage;Hand express;Shells;Pre-pump if needed;Reverse pressure;Hand pump;DEBP;Breast compression;Adjust position  Lactation Tools Discussed/Used Tools: Shells;Pump;Nipple Shields Nipple shield size: 24 Shell Type: Inverted Breast pump type: Double-Electric Breast Pump Pump Review: Setup, frequency, and cleaning Initiated by:: Riley Kill Date initiated:: 02/10/20   Consult Status Consult Status: Follow-up Date: 02/10/20 Follow-up type: In-patient    Charyl Dancer 02/09/2020, 11:28 PM

## 2020-02-09 NOTE — Progress Notes (Signed)
ANTIBIOTIC CONSULT NOTE - INITIAL  Pharmacy Consult for Gentamicin Indication: Chorioamnionitis   No Known Allergies  Patient Measurements: Height: 5\' 5"  (165.1 cm) Weight: 113.2 kg (249 lb 9 oz) IBW/kg (Calculated) : 57 kg Adjusted Body Weight: 80 kg  Vital Signs: Temp: 100.4 F (38 C) (06/21 1251) Temp Source: Oral (06/21 1251) BP: 125/71 (06/21 1230) Pulse Rate: 101 (06/21 1230)  Labs: Recent Labs    02/08/20 0057  WBC 8.3  HGB 12.4  PLT 222   No results for input(s): GENTTROUGH, GENTPEAK, GENTRANDOM in the last 72 hours.   Microbiology: Recent Results (from the past 720 hour(s))  SARS Coronavirus 2 by RT PCR (hospital order, performed in [facility] hospital lab) Nasopharyngeal Nasopharyngeal Swab     Status: None   Collection Time: 02/08/20  1:16 AM   Specimen: Nasopharyngeal Swab  Result Value Ref Range Status   SARS Coronavirus 2 NEGATIVE NEGATIVE Final    Comment: (NOTE) SARS-CoV-2 target nucleic acids are NOT DETECTED.  The SARS-CoV-2 RNA is generally detectable in upper and lower respiratory specimens during the acute phase of infection. The lowest concentration of SARS-CoV-2 viral copies this assay can detect is 250 copies / mL. A negative result does not preclude SARS-CoV-2 infection and should not be used as the sole basis for treatment or other patient management decisions.  A negative result may occur with improper specimen collection / handling, submission of specimen other than nasopharyngeal swab, presence of viral mutation(s) within the areas targeted by this assay, and inadequate number of viral copies (<250 copies / mL). A negative result must be combined with clinical observations, patient history, and epidemiological information.  Fact Sheet for Patients:   02/10/20  Fact Sheet for Healthcare Providers: BoilerBrush.com.cy  This test is not yet approved or  cleared by the https://pope.com/ FDA and has been authorized for detection and/or diagnosis of SARS-CoV-2 by FDA under an Emergency Use Authorization (EUA).  This EUA will remain in effect (meaning this test can be used) for the duration of the COVID-19 declaration under Section 564(b)(1) of the Act, 21 U.S.C. section 360bbb-3(b)(1), unless the authorization is terminated or revoked sooner.  Performed at [facility], [address] [place], [place]     Medications:  Ampicillin 2 grams IV Q6H  Assessment: 29 y.o. female G1P0 at [redacted]w[redacted]d admitted for IOL for post-dates.  Pt now has suspected Triple I.  Ampicillin and Gentamicin are being started.  Plan:  Gentamicin 400 mg (5 mg/kg) IV every 24 hrs  Check Scr with next labs if gentamicin continued.  [redacted]w[redacted]d 02/09/2020,12:54 PM

## 2020-02-09 NOTE — Discharge Summary (Signed)
Postpartum Discharge Summary     Patient Name: Melanie Ball DOB: [birth year] MRN: [id number]  Date of admission: 02/08/2020 Delivery date:02/09/2020  Delivering provider: Chauncey Mann  Date of discharge: 02/11/2020  Admitting diagnosis: Encounter for induction of labor [Z34.90] Intrauterine pregnancy: [redacted]w[redacted]d    Secondary diagnosis:  Active Problems:   Encounter for induction of labor   Chorioamnionitis   Post-dates pregnancy   Maternal obesity affecting pregnancy, antepartum  Additional problems: None    Discharge diagnosis: Term Pregnancy Delivered and Triple I                                              Post partum procedures:None Augmentation: Pitocin, Cytotec and IP Foley Complications: None  Hospital course: Induction of Labor With Vaginal Delivery   29y.o. yo G1P0 at 473w1das admitted to the hospital 02/08/2020 for induction of labor.  Indication for induction: Postdates.  Patient had an uncomplicated labor course as follows: Had Foley bulb placed and given 2 doses of Cytotec for cervical ripening.  Started on pitocin.  Had SROM with clear fluid.  After 11 hours on pitocin, was not making significant cervical change.  Therefore, pitocin stopped and given a third dose of Cytotec.  Restarted pitocin.  Progressed to complete without complication.  Shortly after she began pushing, had a fever of 100.4 and was given ampicillin and gentamicin for presumed triple-I. Membrane Rupture Time/Date: 4:42 PM ,02/08/2020   Delivery Method:Vaginal, Spontaneous  Episiotomy: None  Lacerations:  1st degree  Details of delivery can be found in separate delivery note.  Patient had a routine postpartum course. Patient is discharged home 02/11/20.  Newborn Data: Birth date:02/09/2020  Birth time:2:34 PM  Gender:Female  Living status:Living  Apgars:9 ,9  Weight:3870 g   Magnesium Sulfate received: No BMZ received: No Rhophylac:N/A MMR:N/A T-DaP:Given prenatally Flu:  No Transfusion:No  Physical exam  Vitals:   02/10/20 0543 02/10/20 1606 02/10/20 2305 02/11/20 0518  BP: (!) 97/52 (!) 97/53 122/69 120/65  Pulse: 80 79 93 96  Resp: 16 16    Temp: 98.3 F (36.8 C) 98.6 F (37 C) 98.4 F (36.9 C) 98 F (36.7 C)  TempSrc: Oral Oral Oral Oral  SpO2:   99%   Weight:      Height:       General: alert, cooperative and no distress Lochia: appropriate Uterine Fundus: firm Incision: N/A DVT Evaluation: No evidence of DVT seen on physical exam. Calf/Ankle edema is present Labs: Lab Results  Component Value Date   WBC 8.3 02/08/2020   HGB 12.4 02/08/2020   HCT 37.2 02/08/2020   MCV 96.9 02/08/2020   PLT 222 02/08/2020   No flowsheet data found. Edinburgh Score: Edinburgh Postnatal Depression Scale Screening Tool 02/09/2020  I have been able to laugh and see the funny side of things. 0  I have looked forward with enjoyment to things. 0  I have blamed myself unnecessarily when things went wrong. 2  I have been anxious or worried for no good reason. 2  I have felt scared or panicky for no good reason. 0  Things have been getting on top of me. 0  I have been so unhappy that I have had difficulty sleeping. 1  I have felt sad or miserable. 0  I have been so unhappy that I have been crying. 0  The thought of harming myself has occurred to me. 0  Edinburgh Postnatal Depression Scale Total 5     After visit meds:  Allergies as of 02/11/2020   No Known Allergies     Medication List    TAKE these medications   ibuprofen 600 MG tablet Commonly known as: ADVIL Take 1 tablet (600 mg total) by mouth every 6 (six) hours.   PRENATAL VITAMIN PO Take by mouth.        Discharge home in stable condition Infant Feeding: Breast Infant Disposition:home with mother Discharge instruction: per After Visit Summary and Postpartum booklet. Activity: Advance as tolerated. Pelvic rest for 6 weeks.  Diet: routine diet Future Appointments:No future  appointments. Follow up Visit:  Patient to schedule f/u with HD.   02/11/2020 Stark Klein, MD

## 2020-02-09 NOTE — Lactation Note (Signed)
This note was copied from a baby's chart. Mom is a g1p1 of svd baby boy Bettey Mare now 5 hours old and per mom has not breastfed.   Mom reports no breastfeeding education and plans to stay home with him. Mom reports she does not have a breastpump at home.   Infant fussy on arrival.  Pacifier on bed.  Inquired about it.  Mom reports dad gave him pacifier and he has been sucking on it.   Reviewed AAP recommendations and pacifier usage. Asked if I could assist with [facility] latching since he was cuing and fussing.  Mom agreed.  A few attempts and he would latch latch.  Would open and try but unable to even latch in cross cradle.   After a few attempts, did some hand expression and put him STS to calm him and tried again in football position. Still would open and try to latch.  Tried to manually evert nipple and with manual pump.  Still fussy trying to latch.  Used 24 mm nipple shield since infant previously had a pacifier.  Infant latched quickly and breastfed well.  Rhythmic sucking and swallows seen. Reviewed Understanding Mother and Baby.  Reviewed and left [facility] Breastfeeding handout.   Urged to call lactation as needed.

## 2020-02-09 NOTE — Progress Notes (Signed)
Patient ID: Melanie Ball, female   DOB: 1991/08/03, 29 y.o.   MRN: [id number]  Melanie Ball is a 29 y.o. G1P0 at [redacted]w[redacted]d admitted for IOL for post-dates.  Subjective: Feeling contractions, but tolerating well.  Objective: BP 131/77    Pulse 94    Temp 99.7 F (37.6 C) (Oral)    Resp 16    Ht 5\' 5"  (1.651 m)    Wt 113.2 kg    LMP 04/27/2019    SpO2 100%    BMI 41.53 kg/m  No intake/output data recorded.  FHT:  FHR: 140 bpm, variability: moderate,  accelerations:  Abscent,  decelerations:  Absent UC:   regular, every 1-2 minutes  SVE:   Dilation: 6.5 (unchanged) Effacement (%): 100 Station: -1 Exam by:: stone rnc  Pitocin @ 20 mu/min  Labs: Lab Results  Component Value Date   WBC 8.3 02/08/2020   HGB 12.4 02/08/2020   HCT 37.2 02/08/2020   MCV 96.9 02/08/2020   PLT 222 02/08/2020    Assessment / Plan: 29 y.o.G1P0 at [redacted]w[redacted]d here forIOL for post-dates.  Labor:S/pcytox3 and Foley bulb.SROM with clear fluid.Pitocin started at [redacted]w[redacted]d, with 3rd cyto given during pit break.  IUPC in place.  Continue pitocin. Fetal Wellbeing:Cat I Pain Control:Epidural [id number] Anticipated MOD:SVD   Melanie Ball JJO:ACZYSAYT, MD PGY-2 Resident Family Medicine 02/09/2020, 9:45 AM

## 2020-02-10 MED ORDER — IBUPROFEN 600 MG PO TABS
600.0000 mg | ORAL_TABLET | Freq: Four times a day (QID) | ORAL | Status: DC
  Administered 2020-02-10 – 2020-02-11 (×6): 600 mg via ORAL
  Filled 2020-02-10 (×6): qty 1

## 2020-02-10 MED ORDER — ACETAMINOPHEN 325 MG PO TABS
650.0000 mg | ORAL_TABLET | Freq: Three times a day (TID) | ORAL | Status: DC
  Administered 2020-02-10 – 2020-02-11 (×4): 650 mg via ORAL
  Filled 2020-02-10 (×4): qty 2

## 2020-02-10 NOTE — Progress Notes (Addendum)
POSTPARTUM PROGRESS NOTE  Subjective: Melanie Ball is a 29 y.o. G1P1001 s/p SVDLTCS at [redacted]w[redacted]d.  She reports she doing well. No acute events overnight. She denies any problems with ambulating, voiding or po intake. Denies nausea or vomiting. She has passed flatus. Pain is moderately controlled.  Lochia is appropriate.  Objective: Blood pressure (!) 97/52, pulse 80, temperature 98.3 F (36.8 C), temperature source Oral, resp. rate 16, height 5\' 5"  (1.651 m), weight 113.2 kg, last menstrual period 04/27/2019, SpO2 98 %, unknown if currently breastfeeding.  Physical Exam:  General: alert, cooperative and no distress Chest: no respiratory distress Abdomen: soft, non-tender  Uterine Fundus: firm, appropriately tender Extremities: No calf swelling or tenderness  no edema  Recent Labs    02/08/20 0057  HGB 12.4  HCT 37.2    Assessment/Plan: Melanie Ball is a 29 y.o. G1P1001 s/p SVD at 100w1d after IOL for post-dates.  Routine Postpartum Care: Doing well, pain well-controlled.  -- Continue routine care, lactation support  -- Contraception: Condoms -- Feeding: Breast  Dispo: Plan for discharge tomorrow.  EMILY [redacted]w[redacted]d, MD PGY-2 Resident Family Medicine 02/10/2020, 8:42 AM  GME ATTESTATION:  I saw and evaluated the patient. I agree with the findings and the plan of care as documented in the resident's note.  02/12/2020, DO OB Fellow, Faculty [facility], Center for [facility] 02/10/2020 7:57 PM

## 2020-02-10 NOTE — Lactation Note (Signed)
This note was copied from a baby's chart. Lactation Consultation Note  Patient Name: Melanie Ball [id number] Date: 02/10/2020 Reason for consult: Follow-up assessment;Term  Follow-up assessment of 28-hours [name], breastfeed exclusively with 2.33% weight loss, per RN request. Mother is a primipara, first-time breastfeeding.   Mother stated breastfeeding is going well and baby feed for 30 minutes. Mother reported she has not tried pumping anymore but she has been hand expressing colostrum. Mother is supplementing with express colostrum with a spoon.   Reviewed with mother the benefits of a deep latch to improve breastfeeding session, massaging breast while breastfeeding and how to keep baby awake during session.   Parents are aware to call lactation as needed.     Feeding Feeding Type: Breast Fed   Interventions Interventions: Breast feeding basics reviewed  Lactation Tools Discussed/Used Tools: Nipple Shields   Consult Status Consult Status: Follow-up Date: 02/11/20 Follow-up type: In-patient    Gavon Majano A Higuera Ancidey 02/10/2020, 7:23 PM

## 2020-02-10 NOTE — Progress Notes (Signed)
Discussed risks and benefits of circumcision with the patient. All questions were answered and she wishes to proceed.  Marlowe Alt, DO OB Fellow, Faculty Practice 02/10/2020 7:52 AM

## 2020-02-10 NOTE — Anesthesia Postprocedure Evaluation (Signed)
Anesthesia Post Note  Patient: [name]  Procedure(s) Performed: AN AD HOC LABOR EPIDURAL     Patient location during evaluation: Mother Baby Anesthesia Type: Epidural Level of consciousness: awake and alert and oriented Pain management: satisfactory to patient Vital Signs Assessment: post-procedure vital signs reviewed and stable Respiratory status: spontaneous breathing and nonlabored ventilation Cardiovascular status: stable Postop Assessment: no headache, no backache, no signs of nausea or vomiting, adequate PO intake, patient able to bend at knees and able to ambulate (patient up walking) Anesthetic complications: no   No complications documented.  Last Vitals:  Vitals:   02/10/20 0254 02/10/20 0543  BP: (!) 79/51 (!) 97/52  Pulse: 97 80  Resp: 16 16  Temp: 37.1 C 36.8 C  SpO2: 98%     Last Pain:  Vitals:   02/10/20 0812  TempSrc:   PainSc: 8    Pain Goal: Patients Stated Pain Goal: 0 (02/09/20 0722)                 Madison Hickman

## 2020-02-11 MED ORDER — IBUPROFEN 600 MG PO TABS: 600.0000 mg | ORAL_TABLET | Freq: Four times a day (QID) | ORAL | 0 refills | Status: DC

## 2020-02-11 NOTE — Discharge Instructions (Signed)

## 2020-02-11 NOTE — Lactation Note (Signed)
This note was copied from a baby's chart. Lactation Consultation Note  Patient Name: Melanie Ball [id number] Date: 02/11/2020 Reason for consult: Follow-up assessment  P1 mother whose infant is now 35 hours old.  This is a term baby at 41+1 weeks.  Mother had no questions/concerns related to breast feeding.  She has been using a #24 NS and states that baby latches well with the NS.  She informed me that she has been able to put some of her EBM on the NS prior to latching and her son really enjoys that and will sustain a latch.  Praised her for her effort in using the NS.    Mother will continue to feed 8-12 times/24 hours or sooner if baby shows feeding cues.  She will use the NS as long as she need to until baby is latching and feeding well.  Encouraged mother to pump with the DEBP every time she uses the NS.  She is planning on obtaining a DEBP from the [facility] office after discharge.  Engorgement prevention/treatment reviewed.  Sized mother for the correct flange size and the #24 is appropriate at this time.  However, also informed her that she may need to use the #27 after her milk transitions due to enlargement with stimulation.  Mother verbalized understanding.  She has our OP phone number for questions/concerns after discharge.  Father present.  Family looking forward to discharge today and father has packed all belongings.  RN updated.   Maternal Data    Feeding    LATCH Score                   Interventions    Lactation Tools Discussed/Used     Consult Status Consult Status: Complete Date: 02/11/20 Follow-up type: Call as needed    Naomee Nowland R Braylen Denunzio 02/11/2020, 11:11 AM

## 2020-02-11 NOTE — Lactation Note (Signed)
This note was copied from a baby's chart. Lactation Consultation Note Attempted to see mom. Room dark. Mom resting baby sleeping. Mom looked at [facility] when looked in room. Introduced self. Mom stated baby is feeding good now. Mom asked for Lactation to come back today.  Patient Name: Boy Dulcey Riederer [id number] Date: 02/11/2020     Maternal Data    Feeding    LATCH Score                   Interventions    Lactation Tools Discussed/Used     Consult Status      Camilah Spillman, Diamond Nickel 02/11/2020, 3:09 AM

## 2020-07-09 ENCOUNTER — Ambulatory Visit (INDEPENDENT_AMBULATORY_CARE_PROVIDER_SITE_OTHER): Payer: Medicaid Other

## 2020-07-09 ENCOUNTER — Other Ambulatory Visit: Payer: Self-pay

## 2020-07-09 VITALS — BP 119/70 | HR 89 | Ht 65.0 in | Wt 236.6 lb

## 2020-07-09 DIAGNOSIS — O3680X0 Pregnancy with inconclusive fetal viability, not applicable or unspecified: Secondary | ICD-10-CM

## 2020-07-09 DIAGNOSIS — Z3A12 12 weeks gestation of pregnancy: Secondary | ICD-10-CM | POA: Diagnosis not present

## 2020-07-09 DIAGNOSIS — Z789 Other specified health status: Secondary | ICD-10-CM

## 2020-07-09 DIAGNOSIS — Z3491 Encounter for supervision of normal pregnancy, unspecified, first trimester: Secondary | ICD-10-CM

## 2020-07-09 MED ORDER — BLOOD PRESSURE KIT DEVI
1.0000 | 0 refills | Status: DC
Start: 2020-07-09 — End: 2020-07-09

## 2020-07-09 MED ORDER — BLOOD PRESSURE KIT DEVI: 1.0000 | 0 refills | Status: DC

## 2020-07-09 NOTE — Progress Notes (Signed)
Patient was assessed and managed by nursing staff during this encounter. I have reviewed the chart and agree with the documentation and plan. I have also made any necessary editorial changes.  Andreah Goheen, MD 07/09/2020 11:44 AM    

## 2020-07-09 NOTE — Progress Notes (Signed)
PRENATAL INTAKE SUMMARY  Melanie Ball presents today New OB Nurse Interview.  OB History    Gravida  2   Para  1   Term  1   Preterm      AB      Living  1     SAB      TAB      Ectopic      Multiple  0   Live Births  1          I have reviewed the patient's medical, obstetrical, social, and family histories, medications, and available lab results.  SUBJECTIVE She has no unusual complaints  OBJECTIVE Initial Physical Exam (New OB)  GENERAL APPEARANCE: alert, well appearing   ASSESSMENT Normal pregnancy  PLAN Prenatal care to be completed at [place] labs to be completed at new ob visit Baby scripts ordered Blood pressure kit ordered U/S performed today reveals single live IUP at 75w1dby CWhiteface FHR 161 PHQ 2 score: 0 GAD7 score: 0

## 2020-07-19 ENCOUNTER — Ambulatory Visit (INDEPENDENT_AMBULATORY_CARE_PROVIDER_SITE_OTHER): Payer: Medicaid Other | Admitting: Obstetrics & Gynecology

## 2020-07-19 ENCOUNTER — Other Ambulatory Visit: Payer: Self-pay

## 2020-07-19 ENCOUNTER — Other Ambulatory Visit (HOSPITAL_COMMUNITY)
Admission: RE | Admit: 2020-07-19 | Discharge: 2020-07-19 | Disposition: A | Discharge status: Home / Self Care | Payer: Medicaid Other | Source: Ambulatory Visit | Attending: Obstetrics & Gynecology | Admitting: Obstetrics & Gynecology

## 2020-07-19 VITALS — BP 101/57 | HR 77 | Wt 237.0 lb

## 2020-07-19 DIAGNOSIS — O09892 Supervision of other high risk pregnancies, second trimester: Secondary | ICD-10-CM | POA: Insufficient documentation

## 2020-07-19 DIAGNOSIS — Z3A14 14 weeks gestation of pregnancy: Secondary | ICD-10-CM

## 2020-07-19 DIAGNOSIS — Z3482 Encounter for supervision of other normal pregnancy, second trimester: Secondary | ICD-10-CM

## 2020-07-19 DIAGNOSIS — Z3491 Encounter for supervision of normal pregnancy, unspecified, first trimester: Secondary | ICD-10-CM

## 2020-07-19 MED ORDER — ONDANSETRON HCL 4 MG PO TABS: 4.0000 mg | ORAL_TABLET | Freq: Every day | ORAL | 1 refills | Status: DC | PRN

## 2020-07-19 NOTE — Patient Instructions (Signed)

## 2020-07-19 NOTE — Progress Notes (Signed)
NOB with short interval between pregnancies (34 months old baby boy).  C/o fatigue, NV

## 2020-07-19 NOTE — Progress Notes (Signed)
  Subjective:    Melanie Ball is a G2P1001 [redacted]w[redacted]d being seen today for her first obstetrical visit.  Her obstetrical history is significant for intrauterine growth restriction (IUGR) and obesity. Patient does intend to breast feed. Pregnancy history fully reviewed.  Patient reports nausea, vomiting and worsening fatigue. Of note pt has 55 month old at home. .  Vitals:   07/19/20 1031  BP: (!) 101/57  Pulse: 77  Weight: 237 lb (107.5 kg)    HISTORY: OB History  Gravida Para Term Preterm AB Living  2 1 1     1   SAB TAB Ectopic Multiple Live Births        0 1    # Outcome Date GA Lbr Len/2nd Weight Sex Delivery Anes PTL Lv  2 Current           1 Term 02/09/20 [redacted]w[redacted]d 15:04 / 02:32 8 lb 8.5 oz (3.87 kg) M Vag-Spont EPI  LIV   Past Medical History:  Diagnosis Date  . Medical history non-contributory    Past Surgical History:  Procedure Laterality Date  . NO PAST SURGERIES     No family history on file.   Exam    Pelvic Exam:    Perineum: No Hemorrhoids   Vulva: normal   Vagina:  normal mucosa   Cervix: no lesions   Adnexa: no mass, fullness, tenderness   Bony Pelvis: gynecoid  System: Breast:  normal appearance, no masses or tenderness   Skin: normal coloration and turgor, no rashes    Neurologic: normal, gait normal; reflexes normal and symmetric, no focal deficits   Extremities: normal strength, tone, and muscle mass   HEENT PERRLA   Mouth/Teeth mucous membranes moist, pharynx normal without lesions   Neck supple   Cardiovascular: regular rate and rhythm   Respiratory:  appears well, vitals normal, no respiratory distress, acyanotic, normal RR, ear and throat exam is normal, neck free of mass or lymphadenopathy, chest clear, no wheezing, crepitations, rhonchi, normal symmetric air entry   Abdomen: soft, non-tender; bowel sounds normal; no masses,  no organomegaly   Urinary: urethral meatus normal      Assessment:    Pregnancy: G2P1001 Patient Active  Problem List   Diagnosis Date Noted  . Encounter for supervision of normal pregnancy, unspecified, first trimester 07/09/2020  . Chorioamnionitis 02/09/2020  . Post-dates pregnancy 02/09/2020  . Maternal obesity affecting pregnancy, antepartum 02/09/2020  . Encounter for induction of labor 02/08/2020  . COVID-19 09/12/2019        Plan:     Initial labs drawn. Prenatal vitamins. Problem list reviewed and updated. Genetic Screening discussed Integrated Screen: requested.  Ultrasound discussed; fetal survey: requested.  Follow up in 4 weeks. 80% of 15 min visit spent on counseling and coordination of care.  Discussed short interval pregnancy, precautions given.    09/14/2019 07/19/2020

## 2020-07-20 LAB — CERVICOVAGINAL ANCILLARY ONLY
Bacterial Vaginitis (gardnerella): NEGATIVE
Candida Glabrata: NEGATIVE
Candida Vaginitis: POSITIVE — AB
Chlamydia: POSITIVE — AB
Comment: NEGATIVE
Comment: NEGATIVE
Comment: NEGATIVE
Comment: NEGATIVE
Comment: NEGATIVE
Comment: NORMAL
Neisseria Gonorrhea: NEGATIVE
Trichomonas: NEGATIVE

## 2020-07-20 LAB — CBC/D/PLT+RPR+RH+ABO+RUB AB...
Antibody Screen: NEGATIVE
Basophils Absolute: 0 10*3/uL (ref 0.0–0.2)
Basos: 0 %
EOS (ABSOLUTE): 0.1 10*3/uL (ref 0.0–0.4)
Eos: 1 %
HCV Ab: <0.1 s/co ratio (ref 0.0–0.9)
HIV Screen 4th Generation wRfx: NONREACTIVE
Hematocrit: 37.7 % (ref 34.0–46.6)
Hemoglobin: 12.9 g/dL (ref 11.1–15.9)
Hepatitis B Surface Ag: NEGATIVE
Immature Grans (Abs): 0 10*3/uL (ref 0.0–0.1)
Immature Granulocytes: 1 %
Lymphocytes Absolute: 1.4 10*3/uL (ref 0.7–3.1)
Lymphs: 23 %
MCH: 31.6 pg (ref 26.6–33.0)
MCHC: 34.2 g/dL (ref 31.5–35.7)
MCV: 92 fL (ref 79–97)
Monocytes Absolute: 0.5 10*3/uL (ref 0.1–0.9)
Monocytes: 9 %
Neutrophils Absolute: 3.9 10*3/uL (ref 1.4–7.0)
Neutrophils: 66 %
Platelets: 247 10*3/uL (ref 150–450)
RBC: 4.08 x10E6/uL (ref 3.77–5.28)
RDW: 15.4 % (ref 11.7–15.4)
RPR Ser Ql: NONREACTIVE
Rh Factor: POSITIVE
Rubella Antibodies, IGG: 1.94 index (ref >0.99)
WBC: 5.9 10*3/uL (ref 3.4–10.8)

## 2020-07-20 LAB — HCV INTERPRETATION

## 2020-07-21 ENCOUNTER — Telehealth: Payer: Self-pay | Admitting: Lactation Services

## 2020-07-21 ENCOUNTER — Telehealth: Payer: Self-pay

## 2020-07-21 ENCOUNTER — Other Ambulatory Visit (HOSPITAL_COMMUNITY): Payer: Self-pay | Admitting: Obstetrics & Gynecology

## 2020-07-21 LAB — CYTOLOGY - PAP
Comment: NEGATIVE
Diagnosis: NEGATIVE
High risk HPV: NEGATIVE

## 2020-07-21 MED ORDER — AZITHROMYCIN 500 MG PO TABS
1000.0000 mg | ORAL_TABLET | Freq: Once | ORAL | 1 refills | Status: AC
Start: 2020-07-21 — End: 2020-07-21

## 2020-07-21 MED ORDER — FLUCONAZOLE 150 MG PO TABS: 150.0000 mg | ORAL_TABLET | ORAL | 1 refills | Status: AC

## 2020-07-21 NOTE — Telephone Encounter (Signed)
Attempted to call patient. Received message that call cannot be completed at this time.   Letter created to be sent to patient.

## 2020-07-21 NOTE — Telephone Encounter (Signed)
-----   Message from Malachy Chamber, MD sent at 07/21/2020  3:07 AM EST ----- Azithromycin and diflucan sent to pharmacy

## 2020-07-21 NOTE — Telephone Encounter (Signed)
Called patient to inform her of lab results and treatment.   Patient did not answer. Received message that call cannot be completed at this time.   Will need to call again to give results and recommendations.   STD report faxed to [facility].

## 2020-07-21 NOTE — Telephone Encounter (Signed)
Call patient to make her aware of test results. No answer r voice mail

## 2020-07-22 LAB — URINE CULTURE, OB REFLEX

## 2020-07-22 LAB — CULTURE, OB URINE

## 2020-07-26 ENCOUNTER — Other Ambulatory Visit (HOSPITAL_COMMUNITY): Payer: Self-pay | Admitting: Obstetrics & Gynecology

## 2020-07-26 ENCOUNTER — Telehealth: Payer: Self-pay

## 2020-07-26 MED ORDER — CEPHALEXIN 250 MG PO CAPS: 250.0000 mg | ORAL_CAPSULE | Freq: Three times a day (TID) | ORAL | 0 refills | Status: AC

## 2020-07-26 NOTE — Telephone Encounter (Signed)
-----   Message from Malachy Chamber, MD sent at 07/26/2020  4:14 PM EST ----- Will send in abx, pt now with GBS colonization through urine

## 2020-07-26 NOTE — Telephone Encounter (Signed)
Called Pt to advise that her urine tested + for GBS & antibiotic Keflex was sent to her Pharmacy. Explained that GBS is a bacteria that we carry but can be harmful to the baby if not Tx.Micah Flesher over some things that could happen if not Treated. Pt verbalized understanding.

## 2020-07-27 ENCOUNTER — Encounter: Payer: Self-pay | Admitting: Obstetrics

## 2020-08-09 ENCOUNTER — Other Ambulatory Visit: Payer: Self-pay | Admitting: Family Medicine

## 2020-08-09 ENCOUNTER — Other Ambulatory Visit: Payer: Self-pay

## 2020-08-09 ENCOUNTER — Encounter: Payer: Self-pay | Admitting: Obstetrics

## 2020-08-09 ENCOUNTER — Inpatient Hospital Stay (HOSPITAL_COMMUNITY)
Admission: AD | Admit: 2020-08-09 | Discharge: 2020-08-09 | Disposition: A | Discharge status: Home / Self Care | Payer: Medicaid Other | Attending: Family Medicine | Admitting: Family Medicine

## 2020-08-09 ENCOUNTER — Encounter (HOSPITAL_COMMUNITY): Payer: Self-pay | Admitting: Obstetrics and Gynecology

## 2020-08-09 DIAGNOSIS — R102 Pelvic and perineal pain: Secondary | ICD-10-CM | POA: Diagnosis not present

## 2020-08-09 DIAGNOSIS — Z3A17 17 weeks gestation of pregnancy: Secondary | ICD-10-CM | POA: Diagnosis not present

## 2020-08-09 DIAGNOSIS — Z87891 Personal history of nicotine dependence: Secondary | ICD-10-CM | POA: Insufficient documentation

## 2020-08-09 DIAGNOSIS — O09892 Supervision of other high risk pregnancies, second trimester: Secondary | ICD-10-CM | POA: Diagnosis not present

## 2020-08-09 DIAGNOSIS — O26892 Other specified pregnancy related conditions, second trimester: Secondary | ICD-10-CM | POA: Insufficient documentation

## 2020-08-09 DIAGNOSIS — N949 Unspecified condition associated with female genital organs and menstrual cycle: Secondary | ICD-10-CM

## 2020-08-09 LAB — WET PREP, GENITAL
Clue Cells Wet Prep HPF POC: NONE SEEN
Sperm: NONE SEEN
Trich, Wet Prep: NONE SEEN
Yeast Wet Prep HPF POC: NONE SEEN

## 2020-08-09 LAB — URINALYSIS, ROUTINE W REFLEX MICROSCOPIC
Bilirubin Urine: NEGATIVE
Glucose, UA: NEGATIVE mg/dL
Hgb urine dipstick: NEGATIVE
Ketones, ur: 20 mg/dL — AB
Leukocytes,Ua: NEGATIVE
Nitrite: NEGATIVE
Protein, ur: NEGATIVE mg/dL
Specific Gravity, Urine: 1.021 (ref 1.005–1.030)
pH: 7 (ref 5.0–8.0)

## 2020-08-09 NOTE — MAU Provider Note (Signed)
History     CSN: [id number]  Arrival date and time: 08/09/20 1102   Event Date/Time   First Provider Initiated Contact with Patient 08/09/20 1205      Chief Complaint  Patient presents with  . Abdominal Pain   HPI 29yo G2P1001 at [redacted]w[redacted]d Pregnancy complicated by short interval between pregnancies. She is seen for right lower abdominal cramping for the past few days. Cramping is intermittent. No bleeding, abnormal discharge. Was treated recently for chlamydia and BV.  OB History    Gravida  2   Para  1   Term  1   Preterm      AB      Living  1     SAB      IAB      Ectopic      Multiple  0   Live Births  1           Past Medical History:  Diagnosis Date  . Medical history non-contributory     Past Surgical History:  Procedure Laterality Date  . NO PAST SURGERIES      History reviewed. No pertinent family history.  Social History   Tobacco Use  . Smoking status: Former Smoker    Packs/day: 0.00    Quit date: 02/28/2019    Years since quitting: 1.4  . Smokeless tobacco: Never Used  Vaping Use  . Vaping Use: Never used  Substance Use Topics  . Alcohol use: Not Currently    Comment: last drink 04/2020  . Drug use: Not Currently    Allergies: No Known Allergies  Medications Prior to Admission  Medication Sig Dispense Refill Last Dose  . metroNIDAZOLE (FLAGYL) 500 MG tablet Take 500 mg by mouth 3 (three) times daily.   08/09/2020 at Unknown time  . ondansetron (ZOFRAN) 4 MG tablet Take 1 tablet (4 mg total) by mouth daily as needed for nausea or vomiting. 30 tablet 1 08/09/2020 at Unknown time  . Prenatal Vit-Fe Fumarate-FA (PRENATAL VITAMIN PO) Take by mouth.   08/09/2020 at Unknown time  . Blood Pressure Monitoring (BLOOD PRESSURE KIT) DEVI 1 kit by Does not apply route once a week. 1 each 0     Review of Systems Physical Exam   Blood pressure (!) 117/51, pulse 68, temperature 97.8 F (36.6 C), resp. rate 16, weight 103 kg, last  menstrual period 04/09/2020, not currently breastfeeding.  Physical Exam Vitals reviewed.  Abdominal:     General: Bowel sounds are increased.     Tenderness: There is abdominal tenderness (tenderness on right round ligament). There is no guarding or rebound.  Skin:    Capillary Refill: Capillary refill takes less than 2 seconds.  Neurological:     Mental Status: She is disoriented.  Psychiatric:        Mood and Affect: Mood normal.        Behavior: Behavior normal.    Results for orders placed or performed during the hospital encounter of 08/09/20 (from the past 24 hour(s))  Urinalysis, Routine w reflex microscopic     Status: Abnormal   Collection Time: 08/09/20 11:53 AM  Result Value Ref Range   Color, Urine AMBER (A) YELLOW   APPearance CLOUDY (A) CLEAR   Specific Gravity, Urine 1.021 1.005 - 1.030   pH 7.0 5.0 - 8.0   Glucose, UA NEGATIVE NEGATIVE mg/dL   Hgb urine dipstick NEGATIVE NEGATIVE   Bilirubin Urine NEGATIVE NEGATIVE   Ketones, ur 20 (A) NEGATIVE mg/dL  Protein, ur NEGATIVE NEGATIVE mg/dL   Nitrite NEGATIVE NEGATIVE   Leukocytes,Ua NEGATIVE NEGATIVE  Wet prep, genital     Status: Abnormal   Collection Time: 08/09/20 12:20 PM   Specimen: Genital  Result Value Ref Range   Yeast Wet Prep HPF POC NONE SEEN NONE SEEN   Trich, Wet Prep NONE SEEN NONE SEEN   Clue Cells Wet Prep HPF POC NONE SEEN NONE SEEN   WBC, Wet Prep HPF POC MODERATE (A) NONE SEEN   Sperm NONE SEEN     MAU Course  Procedures  MDM UA, Wet prep normal  Assessment and Plan     ICD-10-CM   1. [redacted] weeks gestation of pregnancy  Z3A.17   2. Round ligament pain  N94.9   3. Short interval between pregnancies affecting pregnancy in second trimester, antepartum  O09.892    Symptomatic treatment. F/u in office.  Truett Mainland 08/09/2020, 12:17 PM

## 2020-08-09 NOTE — MAU Note (Signed)
.   Melanie Ball is a 29 y.o. at [redacted]w[redacted]d here in MAU reporting: lower abdominal pain that comes and goes for a couple of weeks. Denies any vaginal bleeding. Has had one appointment with this pregnancy .  Onset of complaint: on and off for a couple of weeks Pain score: 6 Vitals:   08/09/20 1146  BP: (!) 117/51  Pulse: 68  Resp: 16  Temp: 97.8 F (36.6 C)     FHT:148 Lab orders placed from triage: UA

## 2020-08-09 NOTE — Discharge Instructions (Signed)

## 2020-08-16 ENCOUNTER — Other Ambulatory Visit (HOSPITAL_COMMUNITY)
Admission: RE | Admit: 2020-08-16 | Discharge: 2020-08-16 | Disposition: A | Discharge status: Home / Self Care | Payer: Medicaid Other | Source: Ambulatory Visit | Attending: Advanced Practice Midwife | Admitting: Advanced Practice Midwife

## 2020-08-16 ENCOUNTER — Other Ambulatory Visit: Payer: Self-pay

## 2020-08-16 ENCOUNTER — Ambulatory Visit (INDEPENDENT_AMBULATORY_CARE_PROVIDER_SITE_OTHER): Payer: Medicaid Other | Admitting: Advanced Practice Midwife

## 2020-08-16 VITALS — BP 102/63 | HR 78 | Wt 238.0 lb

## 2020-08-16 DIAGNOSIS — A749 Chlamydial infection, unspecified: Secondary | ICD-10-CM

## 2020-08-16 DIAGNOSIS — O98812 Other maternal infectious and parasitic diseases complicating pregnancy, second trimester: Secondary | ICD-10-CM

## 2020-08-16 DIAGNOSIS — Z3A18 18 weeks gestation of pregnancy: Secondary | ICD-10-CM

## 2020-08-16 DIAGNOSIS — Z3482 Encounter for supervision of other normal pregnancy, second trimester: Secondary | ICD-10-CM | POA: Diagnosis not present

## 2020-08-16 DIAGNOSIS — B951 Streptococcus, group B, as the cause of diseases classified elsewhere: Secondary | ICD-10-CM | POA: Insufficient documentation

## 2020-08-16 DIAGNOSIS — O2342 Unspecified infection of urinary tract in pregnancy, second trimester: Secondary | ICD-10-CM | POA: Diagnosis not present

## 2020-08-16 DIAGNOSIS — Z8616 Personal history of COVID-19: Secondary | ICD-10-CM | POA: Insufficient documentation

## 2020-08-16 DIAGNOSIS — Z7189 Other specified counseling: Secondary | ICD-10-CM

## 2020-08-16 DIAGNOSIS — U071 COVID-19: Secondary | ICD-10-CM

## 2020-08-16 HISTORY — DX: Unspecified infection of urinary tract in pregnancy, second trimester: O23.42

## 2020-08-16 HISTORY — DX: Streptococcus, group b, as the cause of diseases classified elsewhere: B95.1

## 2020-08-16 HISTORY — DX: Chlamydial infection, unspecified: A74.9

## 2020-08-16 HISTORY — DX: Other maternal infectious and parasitic diseases complicating pregnancy, second trimester: O98.812

## 2020-08-16 HISTORY — DX: Personal history of COVID-19: Z86.16

## 2020-08-16 NOTE — Progress Notes (Signed)
ROB, reports no problems today. 

## 2020-08-16 NOTE — Progress Notes (Signed)
   PRENATAL VISIT NOTE  Subjective:  Melanie Ball is a 29 y.o. G2P1001 at [redacted]w[redacted]d being seen today for ongoing prenatal care.  She is currently monitored for the following issues for this low-risk pregnancy and has Encounter for induction of labor; Maternal obesity affecting pregnancy, antepartum; Encounter for supervision of normal pregnancy, unspecified, first trimester; Chlamydia infection affecting pregnancy in second trimester; Group B Streptococcus urinary tract infection affecting pregnancy in second trimester; and History of COVID-19 on their problem list.  Patient reports no bleeding, no contractions, no cramping and no leaking.  Contractions: Not present. Vag. Bleeding: None.   . Denies leaking of fluid.   The following portions of the patient's history were reviewed and updated as appropriate: allergies, current medications, past family history, past medical history, past social history, past surgical history and problem list.   Objective:   Vitals:   08/16/20 1323  BP: 102/63  Pulse: 78  Weight: 108 kg    Fetal Status: Fetal Heart Rate (bpm): 149         General:  Alert, oriented and cooperative. Patient is in no acute distress.  Skin: Skin is warm and dry. No rash noted.   Cardiovascular: Normal heart rate noted  Respiratory: Normal respiratory effort, no problems with respiration noted  Abdomen: Soft, gravid, appropriate for gestational age.  Pain/Pressure: Present     Pelvic: Cervical exam deferred        Extremities: Normal range of motion.  Edema: None  Mental Status: Normal mood and affect. Normal behavior. Normal judgment and thought content.   Assessment and Plan:  Pregnancy: G2P1001 at [redacted]w[redacted]d 1. [redacted] weeks gestation of pregnancy - Fundal size equal to dates via palpation  2. Chlamydia infection affecting pregnancy in second trimester - Patient preferred to self swab for test of cure  - Cervicovaginal ancillary only( [place])   3. Encounter for  supervision of other normal pregnancy in second trimester  - AFP, Serum, Open Spina Bifida  4. Group B Streptococcus urinary tract infection affecting pregnancy in second trimester   Preterm labor symptoms and general obstetric precautions including but not limited to vaginal bleeding, contractions, leaking of fluid and fetal movement were reviewed in detail with the patient. Please refer to After Visit Summary for other counseling recommendations.   Return in about 4 weeks (around 09/13/2020).  Future Appointments  Date Time Provider Department Center  08/27/2020 10:45 AM [facility] US[facility]  09/13/2020  1:40 PM [place]ir[place], Wilmer Floor, CNM [facility] None    Sande Rives, Student-MidWife

## 2020-08-17 LAB — CERVICOVAGINAL ANCILLARY ONLY
Chlamydia: NEGATIVE
Comment: NEGATIVE
Comment: NEGATIVE
Comment: NORMAL
Neisseria Gonorrhea: NEGATIVE
Trichomonas: NEGATIVE

## 2020-08-18 LAB — AFP, SERUM, OPEN SPINA BIFIDA
AFP MoM: 1.01
AFP Value: 34.9 ng/mL
Gest. Age on Collection Date: 18.3 weeks
Maternal Age At EDD: 28.1 yr
OSBR Risk 1 IN: 10000
Test Results:: NEGATIVE
Weight: 238 [lb_av]

## 2020-08-27 ENCOUNTER — Encounter (HOSPITAL_COMMUNITY): Payer: Self-pay | Admitting: Obstetrics and Gynecology

## 2020-08-27 ENCOUNTER — Inpatient Hospital Stay (HOSPITAL_COMMUNITY)
Admission: AD | Admit: 2020-08-27 | Discharge: 2020-08-29 | DRG: 807 | Disposition: A | Discharge status: Home / Self Care | Payer: Medicaid Other | Attending: Obstetrics and Gynecology | Admitting: Obstetrics and Gynecology

## 2020-08-27 ENCOUNTER — Other Ambulatory Visit: Payer: Self-pay | Admitting: Obstetrics & Gynecology

## 2020-08-27 ENCOUNTER — Ambulatory Visit (HOSPITAL_BASED_OUTPATIENT_CLINIC_OR_DEPARTMENT_OTHER): Payer: Medicaid Other

## 2020-08-27 ENCOUNTER — Other Ambulatory Visit: Payer: Self-pay

## 2020-08-27 ENCOUNTER — Ambulatory Visit (HOSPITAL_BASED_OUTPATIENT_CLINIC_OR_DEPARTMENT_OTHER): Payer: Medicaid Other | Admitting: Obstetrics and Gynecology

## 2020-08-27 DIAGNOSIS — O3432 Maternal care for cervical incompetence, second trimester: Secondary | ICD-10-CM | POA: Diagnosis not present

## 2020-08-27 DIAGNOSIS — Z3482 Encounter for supervision of other normal pregnancy, second trimester: Secondary | ICD-10-CM | POA: Insufficient documentation

## 2020-08-27 DIAGNOSIS — Z3A40 40 weeks gestation of pregnancy: Secondary | ICD-10-CM | POA: Diagnosis not present

## 2020-08-27 DIAGNOSIS — O09892 Supervision of other high risk pregnancies, second trimester: Secondary | ICD-10-CM | POA: Insufficient documentation

## 2020-08-27 DIAGNOSIS — O364XX0 Maternal care for intrauterine death, not applicable or unspecified: Principal | ICD-10-CM | POA: Diagnosis present

## 2020-08-27 DIAGNOSIS — E669 Obesity, unspecified: Secondary | ICD-10-CM | POA: Diagnosis present

## 2020-08-27 DIAGNOSIS — O034 Incomplete spontaneous abortion without complication: Secondary | ICD-10-CM

## 2020-08-27 DIAGNOSIS — Z3A20 20 weeks gestation of pregnancy: Secondary | ICD-10-CM

## 2020-08-27 DIAGNOSIS — Z20822 Contact with and (suspected) exposure to covid-19: Secondary | ICD-10-CM | POA: Diagnosis present

## 2020-08-27 DIAGNOSIS — O26892 Other specified pregnancy related conditions, second trimester: Secondary | ICD-10-CM | POA: Diagnosis present

## 2020-08-27 DIAGNOSIS — O42112 Preterm premature rupture of membranes, onset of labor more than 24 hours following rupture, second trimester: Secondary | ICD-10-CM | POA: Diagnosis not present

## 2020-08-27 DIAGNOSIS — O99212 Obesity complicating pregnancy, second trimester: Secondary | ICD-10-CM | POA: Diagnosis present

## 2020-08-27 DIAGNOSIS — O321XX0 Maternal care for breech presentation, not applicable or unspecified: Secondary | ICD-10-CM | POA: Diagnosis not present

## 2020-08-27 HISTORY — DX: Maternal care for cervical incompetence, second trimester: O34.32

## 2020-08-27 LAB — CBC
HCT: 33.6 % — ABNORMAL LOW (ref 36.0–46.0)
Hemoglobin: 11.4 g/dL — ABNORMAL LOW (ref 12.0–15.0)
MCH: 32.9 pg (ref 26.0–34.0)
MCHC: 33.9 g/dL (ref 30.0–36.0)
MCV: 96.8 fL (ref 80.0–100.0)
Platelets: 221 10*3/uL (ref 150–400)
RBC: 3.47 MIL/uL — ABNORMAL LOW (ref 3.87–5.11)
RDW: 14.9 % (ref 11.5–15.5)
WBC: 8.7 10*3/uL (ref 4.0–10.5)
nRBC: 0 % (ref 0.0–0.2)

## 2020-08-27 LAB — RESP PANEL BY RT-PCR (FLU A&B, COVID) ARPGX2
Influenza A by PCR: NEGATIVE
Influenza B by PCR: NEGATIVE
SARS Coronavirus 2 by RT PCR: NEGATIVE

## 2020-08-27 LAB — CREATININE, SERUM
Creatinine, Ser: 0.68 mg/dL (ref 0.44–1.00)
GFR, Estimated: >60 mL/min (ref >60)

## 2020-08-27 LAB — TYPE AND SCREEN
ABO/RH(D): O POS
Antibody Screen: NEGATIVE

## 2020-08-27 MED ORDER — PRENATAL MULTIVITAMIN CH
1.0000 | ORAL_TABLET | Freq: Every day | ORAL | Status: DC
  Administered 2020-08-28: 1 via ORAL
  Filled 2020-08-27: qty 1

## 2020-08-27 MED ORDER — SODIUM CHLORIDE 0.9% FLUSH: 3.0000 mL | INTRAVENOUS | Status: DC | PRN

## 2020-08-27 MED ORDER — SODIUM CHLORIDE 0.9% FLUSH
3.0000 mL | Freq: Two times a day (BID) | INTRAVENOUS | Status: DC
  Administered 2020-08-27 – 2020-08-28 (×2): 3 mL via INTRAVENOUS

## 2020-08-27 MED ORDER — ENOXAPARIN SODIUM 40 MG/0.4ML ~~LOC~~ SOLN
40.0000 mg | SUBCUTANEOUS | Status: DC
  Administered 2020-08-27 – 2020-08-28 (×2): 40 mg via SUBCUTANEOUS
  Filled 2020-08-27 (×2): qty 0.4

## 2020-08-27 MED ORDER — ACETAMINOPHEN 325 MG PO TABS
650.0000 mg | ORAL_TABLET | ORAL | Status: DC | PRN
  Administered 2020-08-27: 650 mg via ORAL
  Filled 2020-08-27: qty 2

## 2020-08-27 MED ORDER — DOCUSATE SODIUM 100 MG PO CAPS
100.0000 mg | ORAL_CAPSULE | Freq: Every day | ORAL | Status: DC
  Administered 2020-08-27: 100 mg via ORAL
  Filled 2020-08-27: qty 1

## 2020-08-27 MED ORDER — CALCIUM CARBONATE ANTACID 500 MG PO CHEW: 2.0000 | CHEWABLE_TABLET | ORAL | Status: DC | PRN

## 2020-08-27 MED ORDER — INFLUENZA VAC SPLIT QUAD 0.5 ML IM SUSY
0.5000 mL | PREFILLED_SYRINGE | INTRAMUSCULAR | Status: DC
  Filled 2020-08-27: qty 0.5

## 2020-08-27 MED ORDER — SODIUM CHLORIDE 0.9 % IV SOLN: 250.0000 mL | INTRAVENOUS | Status: DC | PRN

## 2020-08-27 MED ORDER — ZOLPIDEM TARTRATE 5 MG PO TABS
5.0000 mg | ORAL_TABLET | Freq: Every evening | ORAL | Status: DC | PRN
  Administered 2020-08-27 – 2020-08-29 (×2): 5 mg via ORAL
  Filled 2020-08-27 (×2): qty 1

## 2020-08-27 NOTE — Progress Notes (Signed)
Maternal-Fetal Medicine   Name: Melanie Ball DOB: [birth year] MRN: [id number] Referring Provider: Mariel Aloe, MD  I had the pleasure of seeing Ms. Melanie Ball today at the [facility]. She is G2 P1 at 20-weeks' gestation and is here for fetal anatomy scan.   Obstetric history significant for a term vaginal delivery in June 2021 of a female infant weighing 3,870 grams at birth.  Her pregnancy and delivery were uncomplicated.  On cell free fetal DNA screening, the risks of fetal aneuploidies are not increased.  MSAFP screening showed low risk for open neural tube defects. Patient reports she has been having intermittent vaginal bleeding throughout this pregnancy.  She was evaluated at the MAU for complaints of abdominal pain.  At her prenatal visit on12/27/2021, she reported no vaginal bleeding or discharge.  She screened positive for chlamydial infection that was treated.  GYN history: No history of abnormal Pap smears or cervical surgeries. Past medical history: No history of diabetes or hypertension or any chronic medical conditions. Past surgical history: Nil of note. Medications: Prenatal vitamins. Allergies: No known drug allergies. Social history: Denies tobacco drug or alcohol use in pregnancy.  She is to smoke 2 cigarettes daily before pregnancy.  Ultrasound We performed a fetal anatomical survey.  Fetal biometry is consistent with the previously established dates.  Amniotic fluid is normal and good fetal activity seen.  No markers of aneuploidies or obvious fetal structural defects are seen.  Fetal anatomical survey could not be completed because of fetal position. On transabdominal scan the cervical canal was dilated and the membranes appear to be lying in the vagina.  Fetal feet were seen in the membranes in the cervical canal.  After explaining the transabdominal findings, I performed sterile speculum examination.  Amniotic membrane was bulging into the vagina  and the cervix was visually 4 cm dilated.  On gentle vaginal examination, the cervix is 4 to 5 cm dilated.  The membranes are in the proximal third of the vagina.  No vaginal bleeding was seen.  I counseled the patient on the following Advanced cervical dilation -I explained the findings with the help of ultrasound images and diagrams.  -Advanced cervical dilation is associated with increased risk of miscarriage. -Intermittent vaginal bleeding is the most likely cause.  Patient does not have fever and infection seems unlikely but cannot be ruled out. -Given that she had a term vaginal delivery and no history of cervical surgeries, cervical insufficiency is unlikely. -I informed the patient that cervical cerclage is not performed with advanced cervical dilation and membrane in the vagina.  Patient is aware of a high likelihood of miscarriage.  I recommended inpatient management for 48 hours and reevaluate.  Patient agreed with my recommendation.  I did not counsel on termination of pregnancy.  Later, I explained the findings and recommendations to her mother-in-law using a Spanish interpreter ([employer] services).  Patient was present during my counseling.  I discussed with Dr. Donavan Foil (Ob Attending on call).  Recommendations: -Inpatient management and evaluate in 24 to 48 hours (speculum examination). -Check WBC and temperature to rule out infection. -Antibiotics are not recommended if there is no evidence of intraamniotic infection.   Consultation including face-to-face counseling 45 minutes

## 2020-08-27 NOTE — H&P (Signed)
FACULTY PRACTICE ANTEPARTUM ADMISSION HISTORY AND PHYSICAL NOTE   History of Present Illness: Melanie Ball is a 30 y.o. G2P1001 at 58w0dadmitted for advanced cervical dilation.  During 20 week anatomy scan with MFM  Abnormal findings were noted that were suspcious for advanced cervical dilation.  SSE: showed membranes in the upper vagina.  The membranes were intact and there was no uterine activity or vaginal bleeding.  Patient reports the fetal movement as active. Patient reports uterine contraction  activity as none. Patient reports  vaginal bleeding as none. Patient describes fluid per vagina as None. Fetal presentation is breech.  Patient Active Problem List   Diagnosis Date Noted  . Premature cervical dilation in second trimester 08/27/2020  . Chlamydia infection affecting pregnancy in second trimester 08/16/2020  . Group B Streptococcus urinary tract infection affecting pregnancy in second trimester 08/16/2020  . History of COVID-19 08/16/2020  . Encounter for supervision of normal pregnancy, unspecified, first trimester 07/09/2020  . Maternal obesity affecting pregnancy, antepartum 02/09/2020  . Encounter for induction of labor 02/08/2020    Past Medical History:  Diagnosis Date  . Medical history non-contributory     Past Surgical History:  Procedure Laterality Date  . NO PAST SURGERIES      OB History  Gravida Para Term Preterm AB Living  '2 1 1     1  ' SAB IAB Ectopic Multiple Live Births        0 1    # Outcome Date GA Lbr Len/2nd Weight Sex Delivery Anes PTL Lv  2 Current           1 Term 02/09/20 [id number]:04 / 02:32 3870 g M Vag-Spont EPI  LIV    Social History   Socioeconomic History  . Marital status: Significant Other    Spouse name: Not on file  . Number of children: Not on file  . Years of education: Not on file  . Highest education level: Not on file  Occupational History  . Occupation: unemployed  Tobacco Use  . Smoking status: Former  Smoker    Packs/day: 0.00    Quit date: 02/28/2019    Years since quitting: 1.4  . Smokeless tobacco: Never Used  Vaping Use  . Vaping Use: Never used  Substance and Sexual Activity  . Alcohol use: Not Currently    Comment: last drink 04/2020  . Drug use: Not Currently  . Sexual activity: Yes    Partners: Male    Birth control/protection: None  Other Topics Concern  . Not on file  Social History Narrative  . Not on file   Social Determinants of Health   Financial Resource Strain: Not on file  Food Insecurity: Not on file  Transportation Needs: Not on file  Physical Activity: Not on file  Stress: Not on file  Social Connections: Not on file    History reviewed. No pertinent family history.  No Known Allergies  Medications Prior to Admission  Medication Sig Dispense Refill Last Dose  . Blood Pressure Monitoring (BLOOD PRESSURE KIT) DEVI 1 kit by Does not apply route once a week. 1 each 0   . metroNIDAZOLE (FLAGYL) 500 MG tablet Take 500 mg by mouth 3 (three) times daily.     . ondansetron (ZOFRAN) 4 MG tablet Take 1 tablet (4 mg total) by mouth daily as needed for nausea or vomiting. 30 tablet 1   . Prenatal Vit-Fe Fumarate-FA (PRENATAL VITAMIN PO) Take by mouth.  Review of Systems - General ROS: no fever, chills or abdominal pain, no abnormal vagina discharge or fluid collection.  Vitals:  BP (!) 107/49 (BP Location: Right Arm)   Pulse 77   Temp 98.6 F (37 C) (Oral)   Resp 18   Ht '5\' 5"'  (1.651 m)   Wt 105.7 kg   LMP 04/09/2020   SpO2 100%   BMI 38.78 kg/m  Physical Examination: CONSTITUTIONAL: Well-developed, well-nourished female in no acute distress.  HENT:  Normocephalic, atraumatic, External right and left ear normal. Oropharynx is clear and moist EYES: Conjunctivae and EOM are normal. Pupils are equal, round, and reactive to light. No scleral icterus.  NECK: Normal range of motion, supple, no masses SKIN: Skin is warm and dry. No rash noted. Not  diaphoretic. No erythema. No pallor. [place]: Alert and oriented to person, place, and time. Normal reflexes, muscle tone coordination. No cranial nerve deficit noted. PSYCHIATRIC: Normal mood and affect. Normal behavior. Normal judgment and thought content. CARDIOVASCULAR: Normal heart rate noted, regular rhythm RESPIRATORY: Effort and breath sounds normal, no problems with respiration noted ABDOMEN: Soft, nontender, nondistended, gravid. MUSCULOSKELETAL: Normal range of motion. No edema and no tenderness. 2+ distal pulses.  Cervix: 4-5 cm dilated from previous MFM exam.  Membranes:intact  Labs:  Results for orders placed or performed during the hospital encounter of 08/27/20 (from the past 24 hour(s))  CBC   Collection Time: 08/27/20  3:36 PM  Result Value Ref Range   WBC 8.7 4.0 - 10.5 K/uL   RBC 3.47 (L) 3.87 - 5.11 MIL/uL   Hemoglobin 11.4 (L) 12.0 - 15.0 g/dL   HCT 33.6 (L) 36.0 - 46.0 %   MCV 96.8 80.0 - 100.0 fL   MCH 32.9 26.0 - 34.0 pg   MCHC 33.9 30.0 - 36.0 g/dL   RDW 14.9 11.5 - 15.5 %   Platelets 221 150 - 400 K/uL   nRBC 0.0 0.0 - 0.2 %  Creatinine, serum   Collection Time: 08/27/20  3:36 PM  Result Value Ref Range   Creatinine, Ser 0.68 0.44 - 1.00 mg/dL   GFR, Estimated >60 >60 mL/min  Type and screen [place]   Collection Time: 08/27/20  3:36 PM  Result Value Ref Range   ABO/RH(D) O POS    Antibody Screen NEG    Sample Expiration      08/30/2020,2359 Performed at [facility], [place] [address] [place], [place] [address]     Imaging Studies: [place] MFM OB DETAIL +14 WK  Result Date: 08/27/2020 ----------------------------------------------------------------------  OBSTETRICS REPORT                       (Signed Final 08/27/2020 03:04 pm) ---------------------------------------------------------------------- Patient Info  ID #:       [id number]                          D.O.B.:  1990/10/19 (29 yrs)  Name:       Melanie Ball                Visit Date: 08/27/2020 11:46 am ---------------------------------------------------------------------- Performed By  Attending:        Tama High MD        Ref. Address:     [address]                                                             [place]  Performed By:     [place]     Location[facility]         [facility]                                     [facility]                                                             [place] [facility]                                                             [facility]  Referred By:      Elvera Maria CNM ---------------------------------------------------------------------- Orders  #  Description                           Code        Ordered By  1  [place] MFM OB DETAIL +14 [place]               [address].01    Juanna Cao ----------------------------------------------------------------------  #  Order #                     Accession #                Episode #  1  [id number]                   [id number]                 [id number] ---------------------------------------------------------------------- Indications  [redacted] weeks gestation of pregnancy                Z3A.20 ---------------------------------------------------------------------- Fetal Evaluation  Num Of Fetuses:         1  Cardiac Activity:       Observed  Presentation:           Breech  Placenta:               Anterior  P. Cord Insertion:      Visualized, central  Amniotic Fluid  AFI FV:      Within normal limits                              Largest Pocket(cm)  4.67 ---------------------------------------------------------------------- Biometry  BPD:        43  mm     G. Age:  19w 0d         13  %    CI:        75.35   %    70 - 86                                                          FL/HC:      18.2   %    16.8 - 19.8  HC:      157.1  mm     G. Age:  18w 4d         2.4  %    HC/AC:      1.04        1.09 - 1.39  AC:      150.5  mm     G. Age:  20w 2d         54  %    FL/BPD:     66.5   %  FL:       28.6  mm     G. Age:  18w 5d          9  %    FL/AC:      19.0   %    20 - 24  HUM:      25.6  mm     G. Age:  18w 0d        < 5  %  CER:      18.7  mm     G. Age:  18w 3d         20  %  NFT:       2.0  mm  LV:        5.8  mm  CM:        6.3  mm  Est. FW:     295  gm    0 lb 10 oz      20  % ---------------------------------------------------------------------- OB History  Gravidity:    2         Term:   1  Living:       1 ---------------------------------------------------------------------- Gestational Age  Clinical EDD:  20w 0d                                        EDD:   01/14/21  U/S Today:     19w 1d                                        EDD:   01/20/21  Best:          Hyacinth Meeker 0d     Det. By:  Clinical EDD             EDD:   01/14/21 ---------------------------------------------------------------------- Anatomy  Cranium:               Appears normal         LVOT:  Not well visualized  Cavum:                 Appears normal         Aortic Arch:            Not well visualized  Ventricles:            Appears normal         Ductal Arch:            Appears normal  Choroid Plexus:        Appears normal         Diaphragm:              Not well visualized  Cerebellum:            Appears normal         Stomach:                Not well visualized  Posterior Fossa:       Appears normal         Abdomen:                Not well visualized  Nuchal Fold:           Appears normal         Abdominal Wall:         Not well visualized  Face:                  Not well visualized    Cord Vessels:           Not well visualized  Lips:                  Not well visualized    Kidneys:                Appear normal  Palate:                Not well visualized    Bladder:                Not well visualized  Thoracic:              Appears normal         Spine:                  Appears  normal  Heart:                 Not well visualized    Upper Extremities:      Not well visualized  RVOT:                  Not well visualized    Lower Extremities:      Not well visualized ---------------------------------------------------------------------- Cervix Uterus Adnexa  Cervix  Hourglass membranes into the vagina.  Right Ovary  Within normal limits.  Left Ovary  Within normal limits. ---------------------------------------------------------------------- Impression  We performed a fetal anatomical survey.  Fetal biometry is  consistent with the previously established dates.  Amniotic  fluid is normal and good fetal activity seen.  No markers of  aneuploidies or obvious fetal structural defects are seen.  Fetal anatomical survey could not be completed because of  fetal position.  On transabdominal scan the cervical canal was dilated and  the membranes appear to be lying in the vagina.  Fetal feet  were seen in the membranes in the cervical canal.  xxxxxxxxxxxxxxxxxxxxxxxxxxxxxxxxxxxxxxxxxxxx  Consult Note (From  EPIC)  I had the pleasure of seeing Ms. Tanner Vigna today at  the [facility], [facility]. She is G2 P1 at 20-weeks'  gestation and is here for fetal anatomy scan.  Obstetric history significant for a term vaginal delivery in June  2021 of a female infant weighing 3,870 grams at birth.  Her  pregnancy and delivery were uncomplicated.  On cell free  fetal DNA screening, the risks of fetal aneuploidies are not  increased.  MSAFP screening showed low risk for open  neural tube defects.  Patient reports she has been having intermittent vaginal  bleeding throughout this pregnancy.  She was evaluated at  the [facility] for complaints of abdominal pain.  At her prenatal  visit on12/27/2021, she reported no vaginal bleeding or  discharge.  She screened positive for chlamydial infection  that was treated.  GYN history: No history of abnormal Pap smears or cervical  surgeries.  Past medical history: No history  of diabetes or hypertension  or any chronic medical conditions.  Past surgical history: Nil of note.  Medications: Prenatal vitamins.  Allergies: No known drug allergies.  Social history: Denies tobacco drug or alcohol use in  pregnancy.  She is to smoke 2 cigarettes daily before  pregnancy.  After explaining the transabdominal findings, I performed  sterile speculum examination.  Amniotic membrane was  bulging into the vagina and the cervix was visually 4 cm  dilated.  On gentle vaginal examination, the cervix is 4 to 5  cm dilated.  The membranes are in the proximal third of the  vagina.  No vaginal bleeding was seen.  I counseled the patient on the following  Advanced cervical dilation  -I explained the findings with the help of ultrasound images  and diagrams.  -Advanced cervical dilation is associated with increased risk  of miscarriage.  -Intermittent vaginal bleeding is the most likely cause.  Patient does not have fever and infection seems unlikely but  cannot be ruled out.  -Given that she had a term vaginal delivery and no history of  cervical surgeries, cervical insufficiency is unlikely.  -I informed the patient that cervical cerclage is not performed  with advanced cervical dilation and membrane in the vagina.  Patient is aware of a high likelihood of miscarriage.  I recommended inpatient management for 48 hours and  reevaluate.  Patient agreed with my recommendation.  I did not counsel  on termination of pregnancy.  Later, I explained the findings and recommendations to her  mother-in-law using a Spanish interpreter ([employer] services).  Patient was present during my counseling.  I discussed with Dr. Elgie Congo ([place] Attending on call). ---------------------------------------------------------------------- Recommendations  -Inpatient management and evaluate in 24 to 48 hours  (speculum examination).  -Check WBC and temperature to rule out infection.  -Antibiotics are not recommended if there is no evidence of   intraamniotic infection. ----------------------------------------------------------------------                  Tama High, MD Electronically Signed Final Report   08/27/2020 03:04 pm ----------------------------------------------------------------------    Assessment and Plan: Patient Active Problem List   Diagnosis Date Noted  . Premature cervical dilation in second trimester 08/27/2020  . Chlamydia infection affecting pregnancy in second trimester 08/16/2020  . Group B Streptococcus urinary tract infection affecting pregnancy in second trimester 08/16/2020  . History of COVID-19 08/16/2020  . Encounter for supervision of normal pregnancy, unspecified, first trimester 07/09/2020  . Maternal obesity affecting pregnancy, antepartum 02/09/2020  .  Encounter for induction of labor 02/08/2020   Admit to Antenatal Mild trendelenburg position. Strict bed rest Monitor 24-48 hours, reassess at end of 48 hours.  If dilation has decreased significantly, reconsult MFM to discuss possible emergency cerclage. If dilation is the same or worse discuss possible discharge to home or possible medical management due to high risk of poor outcome.  Lynnda Shields, MD Faculty attending, [facility]

## 2020-08-28 LAB — URINALYSIS, ROUTINE W REFLEX MICROSCOPIC
Bilirubin Urine: NEGATIVE
Glucose, UA: NEGATIVE mg/dL
Ketones, ur: 20 mg/dL — AB
Nitrite: NEGATIVE
Protein, ur: NEGATIVE mg/dL
Specific Gravity, Urine: 1.017 (ref 1.005–1.030)
pH: 6 (ref 5.0–8.0)

## 2020-08-28 LAB — AMNISURE RUPTURE OF MEMBRANE (ROM) NOT AT ARMC: Amnisure ROM: POSITIVE

## 2020-08-28 MED ORDER — FENTANYL CITRATE (PF) 100 MCG/2ML IJ SOLN
50.0000 ug | INTRAMUSCULAR | Status: DC | PRN
  Administered 2020-08-28 (×2): 100 ug via INTRAVENOUS
  Filled 2020-08-28 (×2): qty 2

## 2020-08-28 MED ORDER — SODIUM CHLORIDE 0.9 % IV BOLUS
500.0000 mL | Freq: Once | INTRAVENOUS | Status: AC
  Administered 2020-08-28: 500 mL via INTRAVENOUS

## 2020-08-28 MED ORDER — SODIUM CHLORIDE 0.9 % IV SOLN: INTRAVENOUS | Status: DC

## 2020-08-28 NOTE — Progress Notes (Signed)
Patient ID: Tan[place] Ball, female   DOB: 02-10-1991, 30 y.o.   MRN: [id number] FACULTY PRACTICE ANTEPARTUM(COMPREHENSIVE) NOTE  Melanie Ball is a 30 y.o. G2P1001 at [redacted]w[redacted]d by LMP who is admitted for cervical dilation .   Fetal presentation is breech. Length of Stay:  1  Days  Subjective: No contractions, slight discharge Patient reports the fetal movement as active. Patient reports uterine contraction  activity as none. Patient reports  vaginal bleeding as none. Patient describes fluid per vagina as None.  Vitals:  Blood pressure (!) 96/40, pulse 72, temperature 98.5 F (36.9 C), temperature source Oral, resp. rate 17, height 5\' 5"  (1.651 m), weight 105.7 kg, last menstrual period 04/09/2020, SpO2 100 %, not currently breastfeeding. Physical Examination:  General appearance - alert, well appearing, and in no distress Heart - normal rate and regular rhythm Abdomen - soft, nontender, nondistended Fundal Height:  size equals dates Cervical Exam: Not evaluated. Extremities: extremities normal, atraumatic, no cyanosis or edema and Homans sign is negative, no sign of DVT  Membranes:intact  Fetal Monitoring:  Fetal Heart Rate A   Mode Doppler filed at 08/27/2020 1959  Baseline Rate (A) 159 bpm filed at 08/27/2020 1959     Labs:  Results for orders placed or performed during the hospital encounter of 08/27/20 (from the past 24 hour(s))  Resp Panel by RT-PCR (Flu A&B, Covid) Nasopharyngeal Swab   Collection Time: 08/27/20  3:30 PM   Specimen: Nasopharyngeal Swab; Nasopharyngeal(NP) swabs in vial transport medium  Result Value Ref Range   SARS Coronavirus 2 by RT PCR NEGATIVE NEGATIVE   Influenza A by PCR NEGATIVE NEGATIVE   Influenza B by PCR NEGATIVE NEGATIVE  CBC   Collection Time: 08/27/20  3:36 PM  Result Value Ref Range   WBC 8.7 4.0 - 10.5 K/uL   RBC 3.47 (L) 3.87 - 5.11 MIL/uL   Hemoglobin 11.4 (L) 12.0 - 15.0 g/dL   HCT 10/25/20 (L) 66.4 - 40.3 %   MCV 96.8 80.0 -  100.0 fL   MCH 32.9 26.0 - 34.0 pg   MCHC 33.9 30.0 - 36.0 g/dL   RDW 47.4 25.9 - 56.3 %   Platelets 221 150 - 400 K/uL   nRBC 0.0 0.0 - 0.2 %  Creatinine, serum   Collection Time: 08/27/20  3:36 PM  Result Value Ref Range   Creatinine, Ser 0.68 0.44 - 1.00 mg/dL   GFR, Estimated 10/25/20 >64 mL/min  Type and screen [facility]   Collection Time: 08/27/20  3:36 PM  Result Value Ref Range   ABO/RH(D) O POS    Antibody Screen NEG    Sample Expiration      08/30/2020,2359 Performed at [facility], [address] [place], [place]   Urinalysis, Routine w reflex microscopic Urine, Clean Catch   Collection Time: 08/28/20  2:59 AM  Result Value Ref Range   Color, Urine YELLOW YELLOW   APPearance HAZY (A) CLEAR   Specific Gravity, Urine 1.017 1.005 - 1.030   pH 6.0 5.0 - 8.0   Glucose, UA NEGATIVE NEGATIVE mg/dL   Hgb urine dipstick MODERATE (A) NEGATIVE   Bilirubin Urine NEGATIVE NEGATIVE   Ketones, ur 20 (A) NEGATIVE mg/dL   Protein, ur NEGATIVE NEGATIVE mg/dL   Nitrite NEGATIVE NEGATIVE   Leukocytes,Ua LARGE (A) NEGATIVE   RBC / HPF 0-5 0 - 5 RBC/hpf   WBC, UA 21-50 0 - 5 WBC/hpf   Bacteria, UA RARE (A) NONE SEEN  Squamous Epithelial / LPF 6-10 0 - 5   WBC Clumps PRESENT    Mucus PRESENT      Medications:  Scheduled . docusate sodium  100 mg Oral Daily  . enoxaparin (LOVENOX) injection  40 mg Subcutaneous Q24H  . influenza vac split quadrivalent PF  0.5 mL Intramuscular Tomorrow-1000  . prenatal multivitamin  1 tablet Oral Q1200  . sodium chloride flush  3 mL Intravenous Q12H   I have reviewed the patient's current medications.  ASSESSMENT: Patient Active Problem List   Diagnosis Date Noted  . Premature cervical dilation in second trimester 08/27/2020  . Chlamydia infection affecting pregnancy in second trimester 08/16/2020  . Group B Streptococcus urinary tract infection affecting pregnancy in second trimester 08/16/2020  . History  of COVID-19 08/16/2020  . Encounter for supervision of normal pregnancy, unspecified, first trimester 07/09/2020  . Maternal obesity affecting pregnancy, antepartum 02/09/2020  . Encounter for induction of labor 02/08/2020    PLAN: Bedrest, reassess cervix tomorrow  Scheryl Darter 08/28/2020,7:19 AM

## 2020-08-28 NOTE — Progress Notes (Signed)
CSW met with patient at bedside to provide support. Patient was laying down and on her phone. CSW introduced self and explained reason for consult. CSW inquired about how MOB was doing, MOB reported that she was doing good. CSW inquired about MOB's support system, MOB reported that her boyfriend has been visiting and shared that they have a 83 month old son at home. CSW and MOB briefly discussed her son. MOB appeared happy when speaking about her son. CSW inquired about how MOB was feeling emotionally since she came into the hospital, MOB reported that she was feeling sad. CSW acknowledged and validated MOB's feelings. CSW inquired about how MOB was coping, MOB reported that she has been watching tv and resting. CSW positively affirmed MOB's coping skills. CSW asked MOB if she needs anything, MOB reported no needs. CSW agreed to follow up with MOB next week, MOB agreeable. CSW provided MOB with CSW contact information and encouraged MOB to contact CSW if any needs/concerns arise.   Abundio Miu, [place] Worker [facility] Cell#: [phone]

## 2020-08-28 NOTE — Progress Notes (Signed)
Patient ID: Melanie Ball, female   DOB: 10-09-1990, 30 y.o.   MRN: [id number] Amnisure returned as positive. Discussed with pt.Pt made aware that infant would not survive at this gestational age.  Management options of proceeding with IOL vs expectant management reviewed with pt. She is cramping/contracting every 2-5 minutes, no bleeding. Pt desires to allow labor to progress on it is own now. Discussed pain management with pt. Will order IV. Nursing aware of nature progression of labor.

## 2020-08-28 NOTE — Progress Notes (Signed)
Patient ID: Melanie Ball, female   DOB: 1991-08-09, 30 y.o.   MRN: [id number] SSE appears unchanged from descriptions yesterday Some pooling of fluids as well Amniosure collected Management as per test results. Discussed with pt.

## 2020-08-28 NOTE — Progress Notes (Signed)
Patient ID: Melanie Ball, female   DOB: Oct 26, 1990, 30 y.o.   MRN: [id number] CTSP by nursing after delivery of infant en caul at 2023. No fetal heart tones.  Placenta intact. No lacerations. Cord clamped and cut. Infant wrapped and given to mother.

## 2020-08-29 ENCOUNTER — Encounter (HOSPITAL_COMMUNITY): Payer: Self-pay

## 2020-08-29 LAB — CBC
HCT: 25.9 % — ABNORMAL LOW (ref 36.0–46.0)
Hemoglobin: 9.1 g/dL — ABNORMAL LOW (ref 12.0–15.0)
MCH: 34.3 pg — ABNORMAL HIGH (ref 26.0–34.0)
MCHC: 35.1 g/dL (ref 30.0–36.0)
MCV: 97.7 fL (ref 80.0–100.0)
Platelets: 220 10*3/uL (ref 150–400)
RBC: 2.65 MIL/uL — ABNORMAL LOW (ref 3.87–5.11)
RDW: 14.9 % (ref 11.5–15.5)
WBC: 10.2 10*3/uL (ref 4.0–10.5)
nRBC: 0 % (ref 0.0–0.2)

## 2020-08-29 MED ORDER — IBUPROFEN 600 MG PO TABS: 600.0000 mg | ORAL_TABLET | Freq: Four times a day (QID) | ORAL | 0 refills | Status: DC | PRN

## 2020-08-29 NOTE — Discharge Summary (Signed)
Postpartum Discharge Summary  Date of Service updated 08/29/20     Patient Name: Melanie Ball DOB: 1990/10/10 MRN: [id number]  Date of admission: 08/27/2020 Delivery date:08/28/2020  Delivering provider: Chancy Milroy  Date of discharge: 08/29/2020  Admitting diagnosis: Premature cervical dilation in second trimester [O34.32] Intrauterine pregnancy: [redacted]w[redacted]d    Secondary diagnosis:  Active Problems:   Premature cervical dilation in second trimester  Additional problems: NA    Discharge diagnosis: SVD of non viable female infant                                               Post partum procedures:NA Augmentation: N/A  Com[facility]: [facility] Pt was admitted with above Dx. Reevaluation on 08/28/20 revealed no change in prior cervical exam except for possible rupture. Amnisure confirmed. Pt begin having ut ctx and delivered a non viable female infant on 08/28/20. She was observed afterwards with no problems noted. Ambulating, voiding, tolerating diet and good pain control. Felt amendable for discharge home. Discharge instructions, medications and follow up reviewed with pt. Pt verbalized understanding.   Magnesium Sulfate received: No BMZ received: No Rhophylac:No MMR:No T-DaP:No Flu: Yes Transfusion:No  Physical exam  Vitals:   08/29/20 0004 08/29/20 0048 08/29/20 0049 08/29/20 0439  BP:  (!) 94/49  (!) 90/45  Pulse:  97  84  Resp:  18  16  Temp:    97.8 F (36.6 C)  TempSrc:    Oral  SpO2: 99%  100% 95%  Weight:      Height:       General: alert Lochia: appropriate Uterine Fundus: firm Incision: N/A DVT Evaluation: No evidence of DVT seen on physical exam. Labs: Lab Results  Component Value Date   WBC 10.2 08/29/2020   HGB 9.1 (L) 08/29/2020   HCT 25.9 (L) 08/29/2020   MCV 97.7 08/29/2020   PLT 220 08/29/2020   CMP Latest Ref Rng & Units 08/27/2020  Creatinine 0.44 - 1.00 mg/dL 0.68   Edinburgh Score: Edinburgh Postnatal Depression  Scale Screening Tool 02/09/2020  I have been able to laugh and see the funny side of things. 0  I have looked forward with enjoyment to things. 0  I have blamed myself unnecessarily when things went wrong. 2  I have been anxious or worried for no good reason. 2  I have felt scared or panicky for no good reason. 0  Things have been getting on top of me. 0  I have been so unhappy that I have had difficulty sleeping. 1  I have felt sad or miserable. 0  I have been so unhappy that I have been crying. 0  The thought of harming myself has occurred to me. 0  Edinburgh Postnatal Depression Scale Total 5     After visit meds:  Allergies as of 08/29/2020   No Known Allergies     Medication List    STOP taking these medications   Blood Pressure Kit Devi   metroNIDAZOLE 500 MG tablet Commonly known as: FLAGYL   ondansetron 4 MG tablet Commonly known as: Zofran     TAKE these medications   ibuprofen 600 MG tablet Commonly known as: ADVIL Take 1 tablet (600 mg total) by mouth every 6 (six) hours as needed.   PRENATAL VITAMIN PO Take by mouth.  Discharge home in stable condition Infant Feeding: NA Infant [place] Discharge instruction: per After Visit Summary and Postpartum booklet. Activity: Advance as tolerated. Pelvic rest for 6 weeks.  Diet: routine diet Future Appointments: Future Appointments  Date Time Provider [place]  09/13/2020  1:40 PM Leftwich-Kirby, Kathie Dike, CNM [place] None   Follow up Visit:  Follow-up Information    [place]. Schedule an appointment as soon as possible for a visit in 4 week(s).   Why: 4 weeks for PP visit Contact information: [address] [place] [address] [phone]               Please schedule this patient for a In person postpartum visit in 4 weeks with the following provider: MD. Additional Postpartum F/U:Postpartum Depression checkup  High risk  pregnancy complicated by: advance cervical dilatation and SVD at 20 weeks Delivery mode:  Vaginal, Spontaneous  Anticipated Birth Control:  Unsure   08/29/2020 Chancy Milroy, MD

## 2020-08-29 NOTE — Progress Notes (Incomplete)
Chaplain responded to request for visit. Pt states she is doing fine and she and her partner do not have any needs at this time, but they are looking for financial support for cremation.  Chaplain delivered list of funeral homes from RN, and also shared information with grief support.      08/29/20 1100  Clinical Encounter Type  Visited With Family  Visit Type Initial;Death;Social support  Referral From Nurse  Consult/Referral To Chaplain  Stress Factors  Family Stress Factors Major life changes

## 2020-08-29 NOTE — Lactation Note (Addendum)
Lactation Consultation Note  Patient Name: Melanie Ball Today's Date: 08/29/2020   Age:30 y.o.   LC in to visit with P2 Mom of IUFD at [redacted] weeks gestation.  Mom is 12 hrs post delivery of infant.    Mom in bed and receptive to education.  Infant in cooling bed in room.  Mom has a 51 month old baby at home.   Mom denies any breast fullness yet.  Mom educated on normal onset of Lactogenesis II.  Mom tried to breastfeed her first baby, but switched quickly to formula as she "didn't have enough".  Talked about the benefits of breast milk, ANY breast milk.  Mom instructed she can hand pump for comfort as her breasts start filling and offer her baby EBM.  Mom is not interested in relatching her first baby.  Mom states she has a hand pump at home.  Handout education on how to suppress her milk supply provided.  Mom aware of OP lactation support available to her.  Mom also active on [facility] and is aware that she could talk to [facility] about a DEBP loaner if she decides to pump and provide milk for her 100 month old.    Mom provided with lactation brochure with phone number to call prn for any concerns.     Judee Clara 08/29/2020, 9:01 AM

## 2020-09-13 ENCOUNTER — Telehealth (INDEPENDENT_AMBULATORY_CARE_PROVIDER_SITE_OTHER): Payer: Medicaid Other | Admitting: Advanced Practice Midwife

## 2020-09-13 DIAGNOSIS — Z8759 Personal history of other complications of pregnancy, childbirth and the puerperium: Secondary | ICD-10-CM | POA: Diagnosis not present

## 2020-09-13 DIAGNOSIS — N883 Incompetence of cervix uteri: Secondary | ICD-10-CM | POA: Diagnosis not present

## 2020-09-13 NOTE — Progress Notes (Signed)
incomp cervix 20 weeks Del 08/27/20  Needs PP visit in 3 weeks with MD     GYNECOLOGY VIRTUAL VISIT ENCOUNTER NOTE  Provider location: Center for [facility]   I connected with Melanie Ball on 09/14/20 at  1:40 PM EST by MyChart Video Encounter at home and verified that I am speaking with the correct person using two identifiers.   I discussed the limitations, risks, security and privacy concerns of performing an evaluation and management service virtually and the availability of in person appointments. I also discussed with the patient that there may be a patient responsible charge related to this service. The patient expressed understanding and agreed to proceed.   History:  Melanie Ball is a 30 y.o. G31P1101 female being evaluated today because she is s/p 20 week SAB.  She presented for anatomy US on 08/27/20 and was found to be dilated to 4 cm with membranes lying in the vagina and fetal feet through the cervical canal.  She was sent to MAU and admitted and delivered previable infant on 08/28/20.  She was discharged 08/29/20 without complication. She has brown spotting only at this time and denies any pain.  She desires another pregnancy and does not want to use contraception at this time.    Past Medical History:  Diagnosis Date  . Medical history non-contributory    Past Surgical History:  Procedure Laterality Date  . NO PAST SURGERIES     The following portions of the patient's history were reviewed and updated as appropriate: allergies, current medications, past family history, past medical history, past social history, past surgical history and problem list.   Health Maintenance:  Normal pap 07/19/20.    Review of Systems:  Pertinent items noted in HPI and remainder of comprehensive ROS otherwise negative.  Physical Exam:   General:  Alert, oriented and cooperative. Patient appears to be in no acute distress.  Mental Status: Normal mood and affect.  Normal behavior. Normal judgment and thought content.   Respiratory: Normal respiratory effort, no problems with respiration noted  Rest of physical exam deferred due to type of encounter  Labs and Imaging No results found for this or any previous visit (from the past 336 hour(s)).    Assessment and Plan:     1. Postpartum examination following vaginal delivery --Pt is doing well, good support at home, has her 59 month old at home keeping her in good spirits.   --Offered integrated behavioral health, pt declined at this time. --Physically doing well, light lochia only at this time, no pain --Discussed options including contraception and pt does not desire to prevent pregnancy at this time. --F/U at 4-6 weeks for postpartum visit with MD  2. Incompetent cervix --Incompetent cervix vs early PTL but pt presented with minimal pain and cervix was dilated at anatomy US.   --Discussed with pt management in future pregnancy including serial [place] and possible need for vaginal progesterone and/or cerclage procedure. Questions answered.      I discussed the assessment and treatment plan with the patient. The patient was provided an opportunity to ask questions and all were answered. The patient agreed with the plan and demonstrated an understanding of the instructions.   The patient was advised to call back or seek an in-person evaluation/go to the ED if the symptoms worsen or if the condition fails to improve as anticipated.  I provided 10 minutes of face-to-face time during this encounter.   Sharen Counter, CNM Center for [facility]  Healthcare, [place]

## 2020-09-13 NOTE — Progress Notes (Signed)
Virtual f/u SAB at 20 wks.  Pt notes spotting that is now brown. Pt taking IBP for discomfort.

## 2020-09-14 ENCOUNTER — Encounter: Payer: Self-pay | Admitting: Advanced Practice Midwife

## 2020-09-14 DIAGNOSIS — N883 Incompetence of cervix uteri: Secondary | ICD-10-CM | POA: Insufficient documentation

## 2020-12-06 ENCOUNTER — Ambulatory Visit: Payer: Medicaid Other | Admitting: Obstetrics and Gynecology

## 2020-12-07 ENCOUNTER — Ambulatory Visit (INDEPENDENT_AMBULATORY_CARE_PROVIDER_SITE_OTHER): Payer: Medicaid Other | Admitting: Obstetrics and Gynecology

## 2020-12-07 ENCOUNTER — Encounter: Payer: Self-pay | Admitting: Obstetrics and Gynecology

## 2020-12-07 ENCOUNTER — Other Ambulatory Visit: Payer: Self-pay

## 2020-12-07 DIAGNOSIS — Z3169 Encounter for other general counseling and advice on procreation: Secondary | ICD-10-CM

## 2020-12-07 DIAGNOSIS — Z8742 Personal history of other diseases of the female genital tract: Secondary | ICD-10-CM | POA: Insufficient documentation

## 2020-12-07 HISTORY — DX: Personal history of other diseases of the female genital tract: Z87.42

## 2020-12-07 NOTE — Progress Notes (Signed)
    Post Partum Visit Note  Melanie Ball is a 30 y.o. G22P1011 female who presents for a postpartum visit. She is s/p 1/8 SVD at 20wks due to cervical insufficiency.  I have fully reviewed the prenatal and intrapartum course. Anesthesia: none. Postpartum course c/b nothing. Bleeding none. Bowel function is normal. Bladder function is normal. Patient is sexually active. Contraception method is none. Postpartum depression screening: negative=3.     Edinburgh Postnatal Depression Scale - 12/07/20 1622      Edinburgh Postnatal Depression Scale:  In the Past 7 Days   I have been able to laugh and see the funny side of things. 0    I have looked forward with enjoyment to things. 0    I have blamed myself unnecessarily when things went wrong. 0    I have been anxious or worried for no good reason. 3    I have felt scared or panicky for no good reason. 0    Things have been getting on top of me. 0    I have been so unhappy that I have had difficulty sleeping. 0    I have felt sad or miserable. 0    I have been so unhappy that I have been crying. 0    The thought of harming myself has occurred to me. 0    Edinburgh Postnatal Depression Scale Total 3           Health Maintenance Due  Topic Date Due  . COVID-19 Vaccine (1) Never done  . TETANUS/TDAP  Never done    Review of Systems Pertinent items noted in HPI and remainder of comprehensive ROS otherwise negative.  Objective:  BP 110/73   Pulse 65   Ht 5\' 5"  (1.651 m)   Wt 242 lb (109.8 kg)   LMP 12/06/2020 (Exact Date)   BMI 40.27 kg/m    NAD Assessment:   Patient stable Plan:   I d/w her the events of what happened and I told her that the only modifiable risk factor I see is the pregnancy being a close interval one, since she delivered in June Ball.  I told her I recommend waiting at least nine months before trying again, ideally a year; she states they are already trying to conceive and she has had a period since  delivery. I told her I don't recommend trying so soon as it puts her at risk repeat occurrence. I also told her to let Melanie Ball know as soon as she's pregnant because I would recommend a prophylactic cerclage at 12-14 weeks in future pregnancies. I told her I recommend to start a multivitamin with folic acid now and to let us know if she'd like to start something for contraception in the mean time.   RTC: 73m for annual   1m. MD Center for [facility], [facility]

## 2021-03-09 ENCOUNTER — Ambulatory Visit: Payer: Medicaid Other | Admitting: Women's Health

## 2021-03-14 ENCOUNTER — Encounter: Payer: Self-pay | Admitting: Obstetrics

## 2021-03-14 ENCOUNTER — Other Ambulatory Visit (HOSPITAL_COMMUNITY)
Admission: RE | Admit: 2021-03-14 | Discharge: 2021-03-14 | Disposition: A | Discharge status: Home / Self Care | Payer: Medicaid Other | Source: Ambulatory Visit | Attending: Women's Health | Admitting: Women's Health

## 2021-03-14 ENCOUNTER — Other Ambulatory Visit: Payer: Self-pay

## 2021-03-14 ENCOUNTER — Ambulatory Visit (INDEPENDENT_AMBULATORY_CARE_PROVIDER_SITE_OTHER): Payer: Medicaid Other | Admitting: Obstetrics

## 2021-03-14 VITALS — BP 119/71 | HR 64 | Ht 65.0 in | Wt 243.8 lb

## 2021-03-14 DIAGNOSIS — N926 Irregular menstruation, unspecified: Secondary | ICD-10-CM

## 2021-03-14 DIAGNOSIS — R1031 Right lower quadrant pain: Secondary | ICD-10-CM | POA: Diagnosis not present

## 2021-03-14 DIAGNOSIS — Z6841 Body Mass Index (BMI) 40.0 and over, adult: Secondary | ICD-10-CM

## 2021-03-14 DIAGNOSIS — N898 Other specified noninflammatory disorders of vagina: Secondary | ICD-10-CM | POA: Diagnosis not present

## 2021-03-14 DIAGNOSIS — R1032 Left lower quadrant pain: Secondary | ICD-10-CM | POA: Diagnosis not present

## 2021-03-14 LAB — POCT URINALYSIS DIPSTICK
Bilirubin, UA: NEGATIVE
Blood, UA: NEGATIVE
Glucose, UA: NEGATIVE
Ketones, UA: NEGATIVE
Leukocytes, UA: NEGATIVE
Nitrite, UA: NEGATIVE
Protein, UA: NEGATIVE
Spec Grav, UA: 1.01 (ref 1.010–1.025)
Urobilinogen, UA: 0.2 E.U./dL
pH, UA: 7 (ref 5.0–8.0)

## 2021-03-14 LAB — POCT URINE PREGNANCY
Preg Test, Ur: NEGATIVE
Preg Test, Ur: NEGATIVE

## 2021-03-14 NOTE — Progress Notes (Signed)
Patient presents to discuss periods. Patient states that she had a last full period in April for about 3 days. She states that she only had one day of spotting May, June, and July. Denies being on any birth control. She states that she took a HPT on 6/14 with a negative result, and then 2 days later she had one day of spotting. States that her periods have been regular until May. Patient denies having any vaginal discharge, odor, or irritation. She does complain of some bilateral lower abdominal pain.  Last Pap 06/2020 Normal

## 2021-03-14 NOTE — Progress Notes (Signed)
Patient ID: Melanie Ball, female   DOB: [birth year], 30 y.o.   MRN: [id number]  Chief Complaint  Patient presents with   Gynecologic Exam    HPI Melanie Ball is a 30 y.o. female.  Complains of irregular periods since May.  Contraception:  None  Also c/o bilateral lower abdominal pain. HPI  Past Medical History:  Diagnosis Date   BMI 40.0-44.9, adult (HCC)    Chlamydia infection affecting pregnancy in second trimester 08/16/2020   Group B Streptococcus urinary tract infection affecting pregnancy in second trimester 08/16/2020   Less than 10,000 colonies, needs prophylaxis in labor    History of COVID-19 08/16/2020   +COVID at [facility] 09/11/19    Past Surgical History:  Procedure Laterality Date   NO PAST SURGERIES      History reviewed. No pertinent family history.  Social History Social History   Tobacco Use   Smoking status: Former    Packs/day: 0.00    Types: Cigarettes    Quit date: 02/28/2019    Years since quitting: 2.0   Smokeless tobacco: Never  Vaping Use   Vaping Use: Never used  Substance Use Topics   Alcohol use: Yes    Comment: about 1x per weel   Drug use: Not Currently    No Known Allergies  Current Outpatient Medications  Medication Sig Dispense Refill   ibuprofen (ADVIL) 600 MG tablet Take 1 tablet (600 mg total) by mouth every 6 (six) hours as needed. (Patient not taking: Reported on 03/14/2021) 30 tablet 0   No current facility-administered medications for this visit.    Review of Systems Review of Systems Constitutional: negative for fatigue and weight loss Respiratory: negative for cough and wheezing Cardiovascular: negative for chest pain, fatigue and palpitations Gastrointestinal: negative for abdominal pain and change in bowel habits Genitourinary:positive for irregular periods, pelvic pain and vaginal discharge Integument/breast: negative for nipple discharge Musculoskeletal:negative for myalgias Neurological: negative for  gait problems and tremors Behavioral/Psych: negative for abusive relationship, depression Endocrine: negative for temperature intolerance      Blood pressure 119/71, pulse 64, height 5\' 5"  (1.651 m), weight 243 lb 12.8 oz (110.6 kg), last menstrual period 12/05/2020, unknown if currently breastfeeding.  Physical Exam Physical Exam General:   Alert and no distress  Skin:   no rash or abnormalities  Lungs:   clear to auscultation bilaterally  Heart:   regular rate and rhythm, S1, S2 normal, no murmur, click, rub or gallop  Breasts:   Not examined  Abdomen:  normal findings: no organomegaly, soft, non-tender and no hernia  Pelvis:  External genitalia: normal general appearance Urinary system: urethral meatus normal and bladder without fullness, nontender Vaginal: normal without tenderness, induration or masses Cervix: normal appearance Adnexa: normal bimanual exam Uterus: anteverted and non-tender, normal size   I have spent a total of 20 minutes of face-to-face time, excluding clinical staff time, reviewing notes and preparing to see patient, ordering tests and/or medications, and counseling the patient.   Data Reviewed Cultures  Assessment     1. Irregular periods Rx: - 12/07/2020 PELVIC COMPLETE WITH TRANSVAGINAL; Future - POCT urine pregnancy - POCT Urinalysis Dipstick - POCT urine pregnancy  2. Vaginal discharge Rx: - Cervicovaginal ancillary only( [place])  3. Class 3 severe obesity due to excess calories without serious comorbidity with body mass index (BMI) of 40.0 to 44.9 in adult (HCC) - weight loss with the aid of dietary changes, exercise and behavioral modification recommended     Plan  Follow up in 5 months for Annual / Pap  Orders Placed This Encounter  Procedures   US PELVIC COMPLETE WITH TRANSVAGINAL    Standing Status:   Future    Standing Expiration Date:   03/14/2022    Order Specific Question:   Reason for Exam (SYMPTOM  OR DIAGNOSIS REQUIRED)     Answer:   Irregular periods    Order Specific Question:   Preferred imaging location?    Answer:   [facility] Ultrasound   POCT urine pregnancy   POCT Urinalysis Dipstick   POCT urine pregnancy     Brock Bad, MD 03/14/2021 11:38 AM

## 2021-03-15 LAB — CERVICOVAGINAL ANCILLARY ONLY
Bacterial Vaginitis (gardnerella): NEGATIVE
Candida Glabrata: NEGATIVE
Candida Vaginitis: POSITIVE — AB
Chlamydia: NEGATIVE
Comment: NEGATIVE
Comment: NEGATIVE
Comment: NEGATIVE
Comment: NEGATIVE
Comment: NEGATIVE
Comment: NORMAL
Neisseria Gonorrhea: NEGATIVE
Trichomonas: NEGATIVE

## 2021-03-17 ENCOUNTER — Other Ambulatory Visit: Payer: Self-pay | Admitting: Obstetrics

## 2021-03-17 ENCOUNTER — Ambulatory Visit: Payer: Medicaid Other | Attending: Obstetrics

## 2021-03-17 DIAGNOSIS — B3731 Acute candidiasis of vulva and vagina: Secondary | ICD-10-CM

## 2021-03-17 DIAGNOSIS — B373 Candidiasis of vulva and vagina: Secondary | ICD-10-CM

## 2021-03-17 MED ORDER — FLUCONAZOLE 150 MG PO TABS: 150.0000 mg | ORAL_TABLET | Freq: Once | ORAL | 0 refills | Status: AC

## 2021-06-20 ENCOUNTER — Other Ambulatory Visit: Payer: Self-pay

## 2021-06-20 ENCOUNTER — Ambulatory Visit
Admission: RE | Admit: 2021-06-20 | Discharge: 2021-06-20 | Disposition: A | Discharge status: Home / Self Care | Payer: Medicaid Other | Source: Ambulatory Visit | Attending: Obstetrics | Admitting: Obstetrics

## 2021-06-20 DIAGNOSIS — N926 Irregular menstruation, unspecified: Secondary | ICD-10-CM | POA: Insufficient documentation

## 2021-06-21 ENCOUNTER — Telehealth: Payer: Self-pay | Admitting: General Practice

## 2021-06-21 NOTE — Telephone Encounter (Signed)
Patient called and left message on nurse voicemail line stating she isn't able to log into mychart to see her results. Patient would like callback.

## 2021-07-19 ENCOUNTER — Telehealth (INDEPENDENT_AMBULATORY_CARE_PROVIDER_SITE_OTHER): Payer: Medicaid Other | Admitting: Obstetrics

## 2021-07-19 DIAGNOSIS — Z6841 Body Mass Index (BMI) 40.0 and over, adult: Secondary | ICD-10-CM

## 2021-07-19 DIAGNOSIS — R102 Pelvic and perineal pain: Secondary | ICD-10-CM

## 2021-07-19 DIAGNOSIS — N926 Irregular menstruation, unspecified: Secondary | ICD-10-CM | POA: Diagnosis not present

## 2021-07-19 NOTE — Progress Notes (Signed)
GYNECOLOGY VIRTUAL VISIT ENCOUNTER NOTE  Provider location: Center for [facility]   Patient location: Home  I connected with Melanie Ball on 07/19/21 at  1:50 PM EST by MyChart Video Encounter and verified that I am speaking with the correct person using two identifiers.   I discussed the limitations, risks, security and privacy concerns of performing an evaluation and management service virtually and the availability of in person appointments. I also discussed with the patient that there may be a patient responsible charge related to this service. The patient expressed understanding and agreed to proceed.   History:  Melanie Ball is a 30 y.o. G68P1011 female being evaluated today for follow up for irregular periods and pelvic pain. She denies any abnormal vaginal discharge, bleeding, pelvic pain or other concerns.       Past Medical History:  Diagnosis Date   BMI 40.0-44.9, adult (HCC)    Chlamydia infection affecting pregnancy in second trimester 08/16/2020   Group B Streptococcus urinary tract infection affecting pregnancy in second trimester 08/16/2020   Less than 10,000 colonies, needs prophylaxis in labor    History of COVID-19 08/16/2020   +COVID at [facility] 09/11/19   Past Surgical History:  Procedure Laterality Date   NO PAST SURGERIES     The following portions of the patient's history were reviewed and updated as appropriate: allergies, current medications, past family history, past medical history, past social history, past surgical history and problem list.   Health Maintenance:  Normal pap and negative HRHPV on 07-19-2021.    Review of Systems:  Pertinent items noted in HPI and remainder of comprehensive ROS otherwise negative.  Physical Exam:   General:  Alert, oriented and cooperative. Patient appears to be in no acute distress.  Mental Status: Normal mood and affect. Normal behavior. Normal judgment and thought content.    Respiratory: Normal respiratory effort, no problems with respiration noted  Rest of physical exam deferred due to type of encounter  Labs and Imaging No results found for this or any previous visit (from the past 336 hour(s)). US PELVIC COMPLETE WITH TRANSVAGINAL  Result Date: 06/20/2021 CLINICAL DATA:  Irregular menses, BILATERAL lower quadrant abdominal pain intermittently RIGHT greater than LEFT LMP July 2022, G2P1. EXAM: TRANSABDOMINAL AND TRANSVAGINAL ULTRASOUND OF PELVIS TECHNIQUE: Both transabdominal and transvaginal ultrasound examinations of the pelvis were performed. Transabdominal technique was performed for global imaging of the pelvis including uterus, ovaries, adnexal regions, and pelvic cul-de-sac. It was necessary to proceed with endovaginal exam following the transabdominal exam to visualize the ovaries. COMPARISON:  None FINDINGS: Uterus Measurements: 8.0 x 3.1 x 5.3 cm = volume: 69 mL. Anteverted. Fairly homogeneous myometrial echogenicity. Questionable subtle mass at anterior superior RIGHT fundus 10 mm diameter, subserosal, best visualized in transverse, image 82. Endometrium Thickness: 10 mm.  No endometrial fluid or mass Right ovary Measurements: 3.8 x 2.2 x 2.4 cm = volume: 10.3 mL. Normal morphology without mass Left ovary Measurements: 3.2 x 2.0 x 2.5 cm = volume: 8.7 mL. Normal morphology without mass Other findings No free pelvic fluid.  No adnexal masses. IMPRESSION: Normal exam. Electronically Signed   By: Ulyses Southward M.D.   On: 06/20/2021 11:52       Assessment and Plan:     1. Irregular periods - may need OCP regulation of menstrual cycles because of anovulation from obesity and  some degree of PCOS with abnormal ovarian hormonal metabolism.  Will discuss this hypothesis and some management recommendations at  next visit in a week   2. Pelvic pain, mild, resolving - will follow clinically - Ibuprofen prn  3. Class 3 severe obesity due to excess calories without  serious comorbidity with body mass index (BMI) of 40.0 to 44.9 in adult (HCC) - weight reduction with the aid of dietary changes, exercise and behavioral modification may improve ovarian metabolism and regulation of cycles      I discussed the assessment and treatment plan with the patient. The patient was provided an opportunity to ask questions and all were answered. The patient agreed with the plan and demonstrated an understanding of the instructions.   The patient was advised to call back or seek an in-person evaluation/go to the ED if the symptoms worsen or if the condition fails to improve as anticipated.  I have spent a total of 15 minutes of non-face-to-face time, excluding clinical staff time, reviewing notes and preparing to see patient, ordering tests and/or medications, and counseling the patient.    Coral Ceo, MD Center for [facility], [facility]  07/19/21

## 2021-07-27 ENCOUNTER — Ambulatory Visit: Payer: Medicaid Other | Admitting: Obstetrics

## 2021-12-28 IMAGING — US US OB LIMITED
1 series · 5 of 5 positions shown · non-contrast
Comparison: none

[Series 1: us ob limited · 5 of 5 slices shown]
[im 1/5]
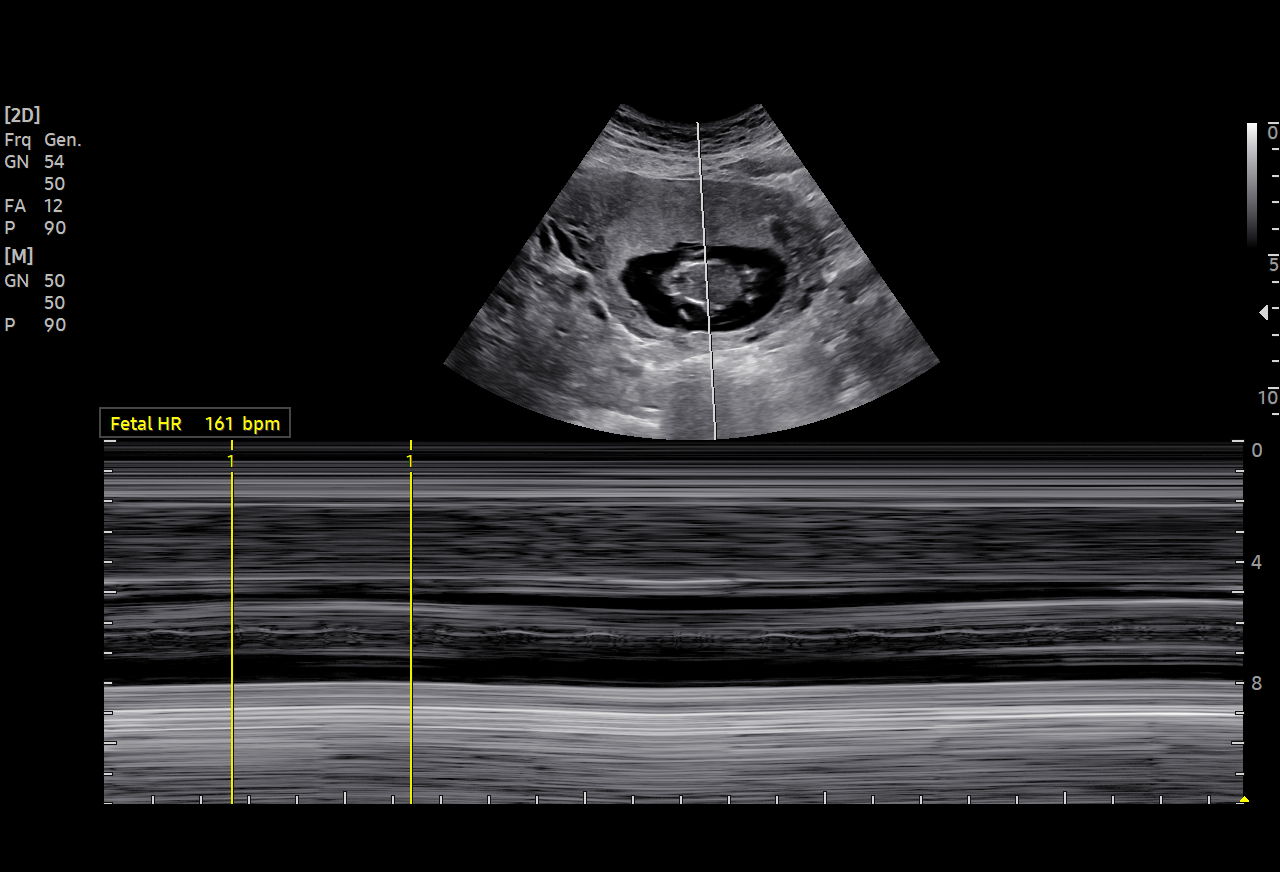
[im 2/5]
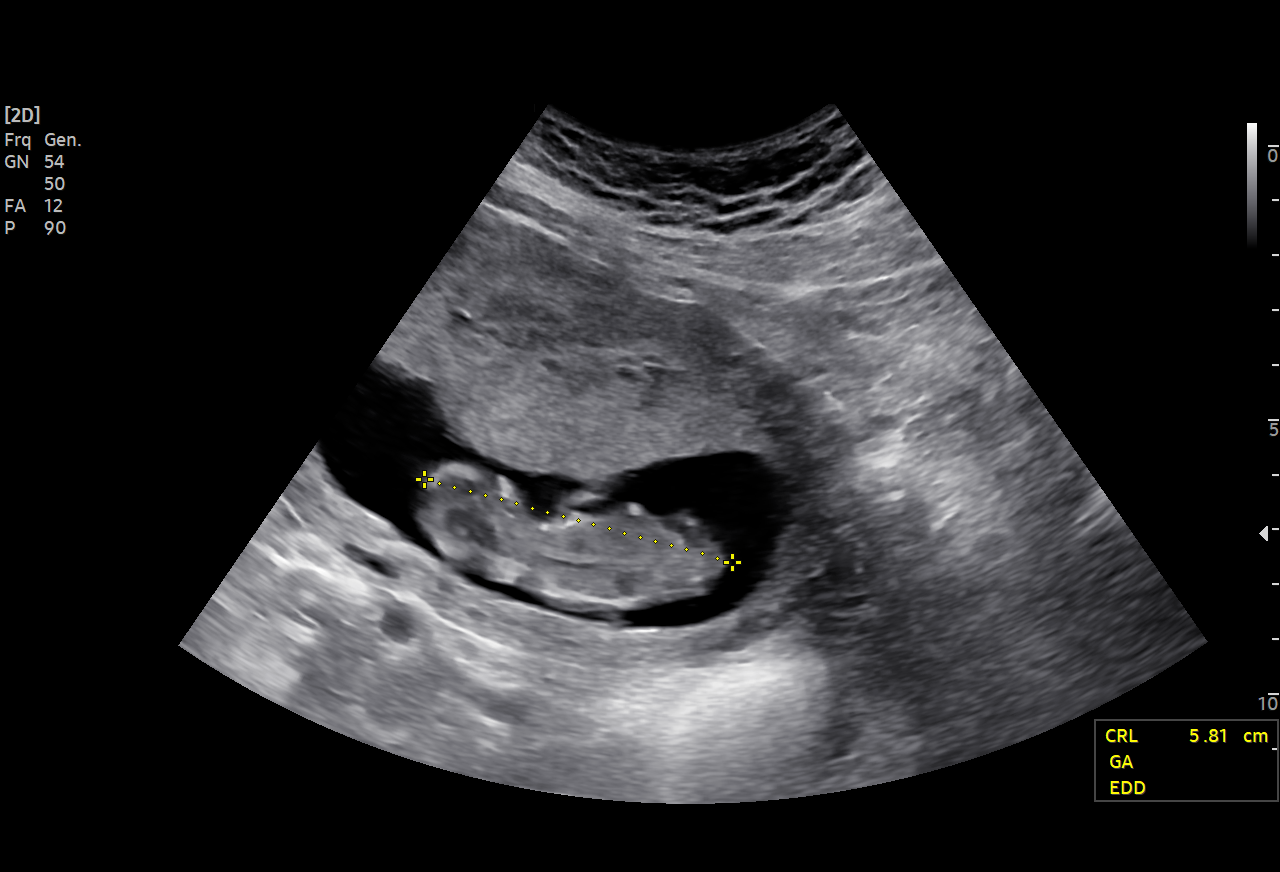
[im 3/5]
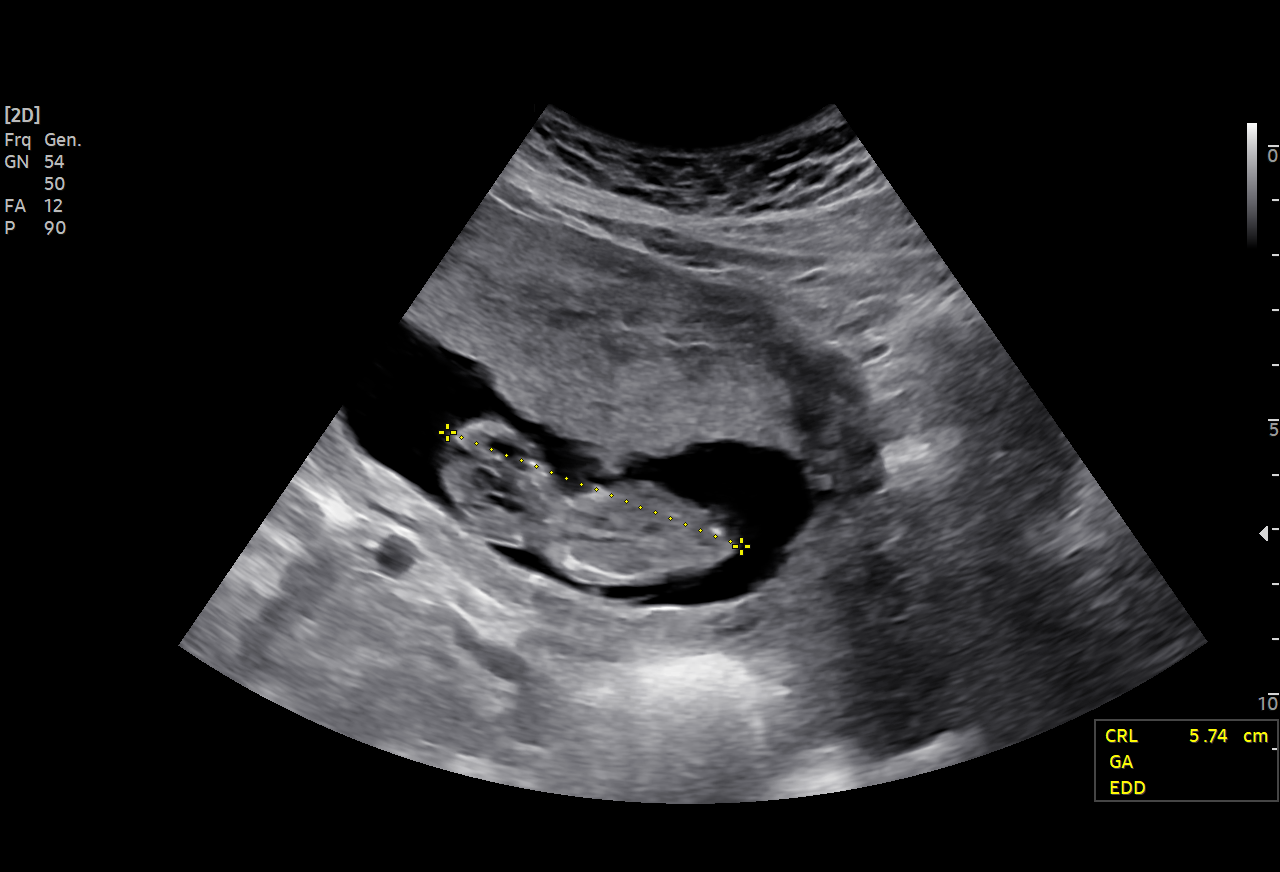
[im 4/5]
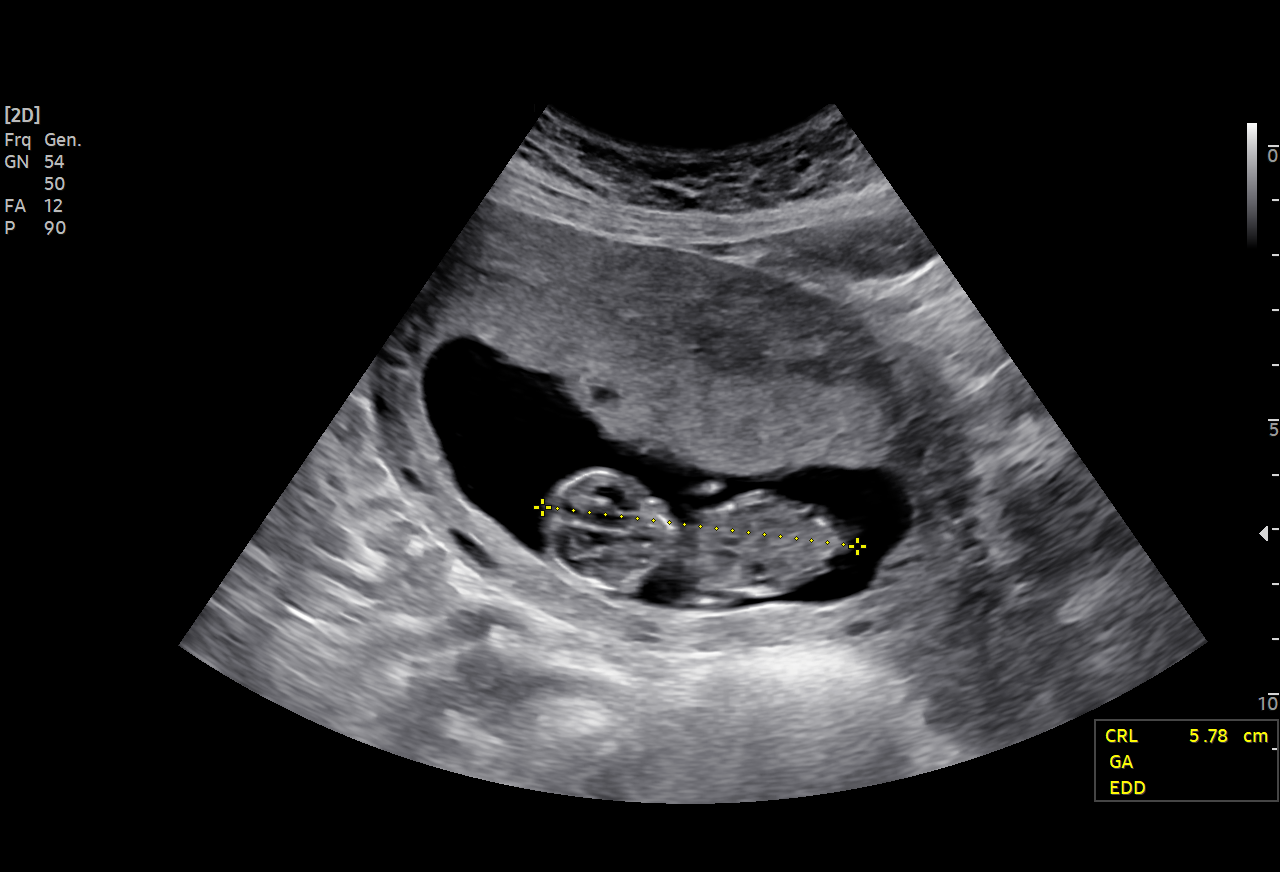
[im 5/5]
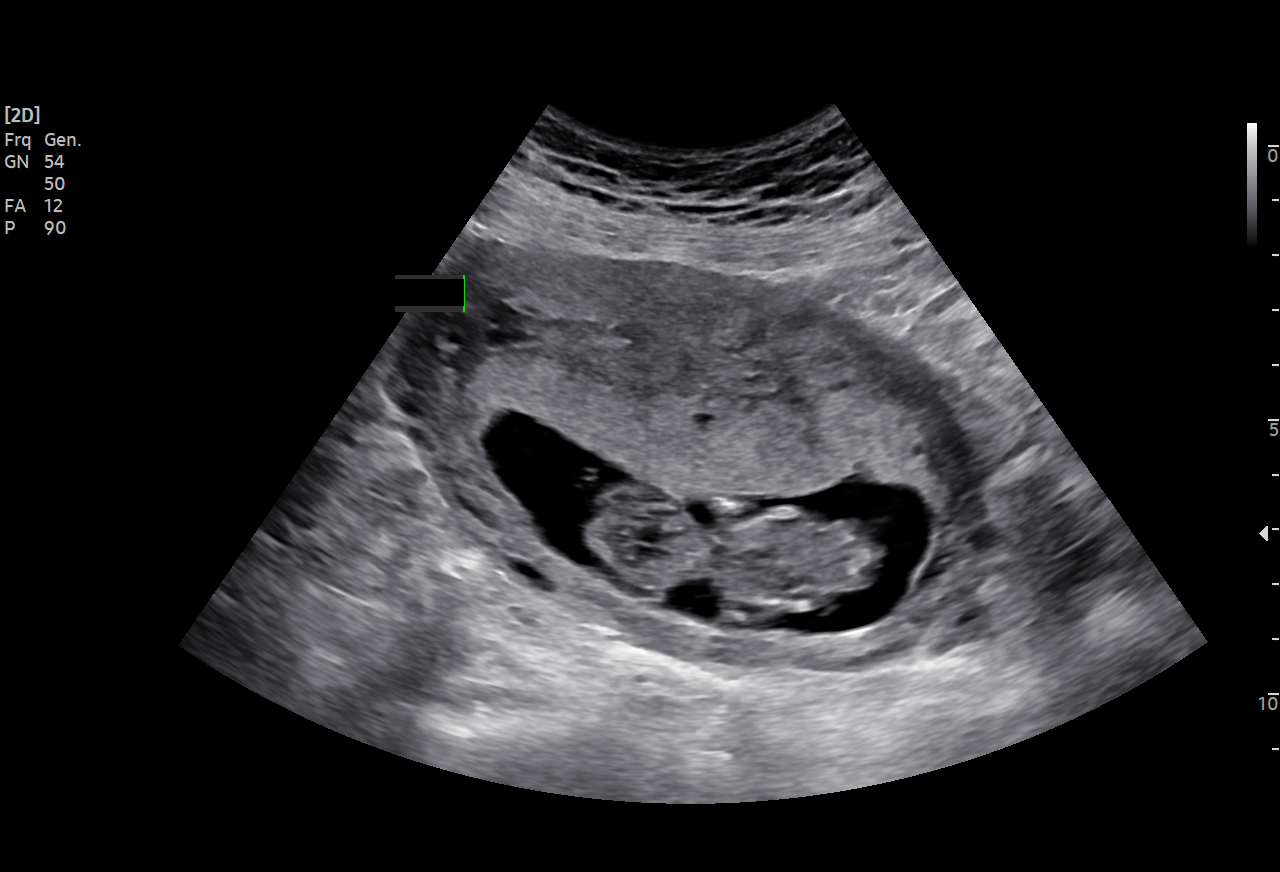

[5 of 5 positions shown; findings below may reference images not displayed]

Healthcare

 1   [HOSPITAL]                        [address].0      GJOZI BADUNI

Indications

 12 weeks gestation of pregnancy
Fetal Evaluation

 Num Of Fetuses:          1
 Fetal Heart Rate(bpm):   161
 Cardiac Activity:        Observed
Biometry

 CRL:      57.8   mm     G. Age:  12w 1d                   EDD:   01/20/21
Gestational Age

 Best:           12w 1d    Det. By:  U/S C R L (07/09/20)       EDD:  01/20/21
Comments

 Single live IUP at 53w5d by CRL. LMP 04/09/20.
Impression

 Viable intrauterine pregnancy
Recommendations

 Routine prenatal care
                 [place], Monard Robert

## 2022-12-09 IMAGING — US US PELVIS COMPLETE WITH TRANSVAGINAL
1 series · 15 of 25 positions shown · non-contrast
Comparison: None

CLINICAL DATA: Irregular menses, BILATERAL lower quadrant abdominal
pain intermittently RIGHT greater than LEFT LMP February 2021, G2P1.



[Series 1: us pelvis complete with transvaginal · 109 acquisitions, 15 frames shown]
[im 1/109]
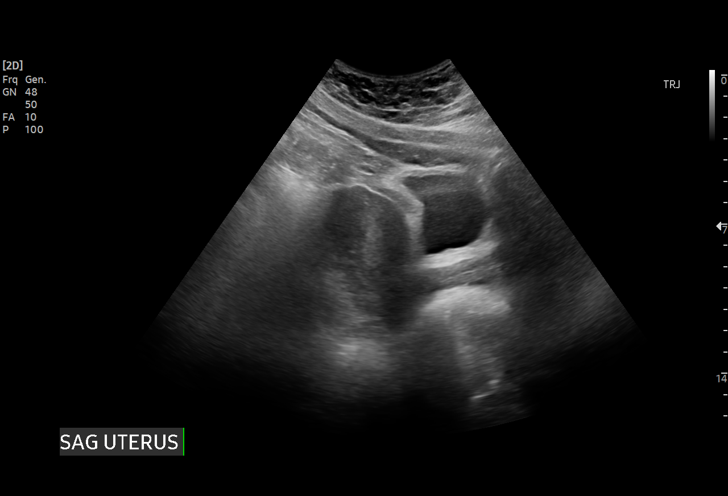
[im 10/109]
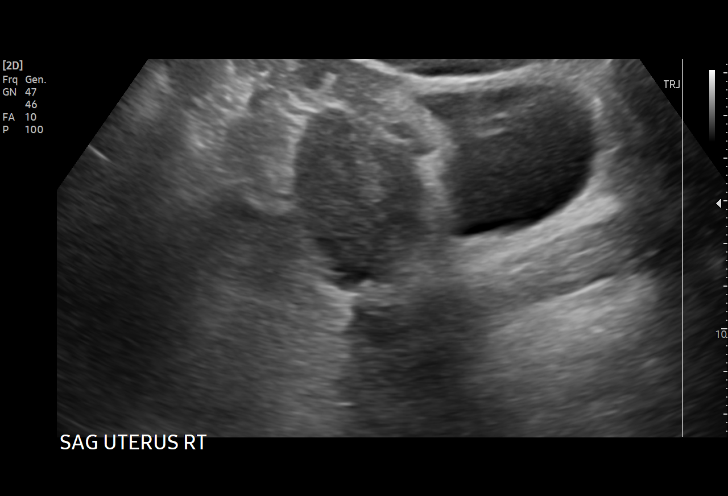
[im 19/109]
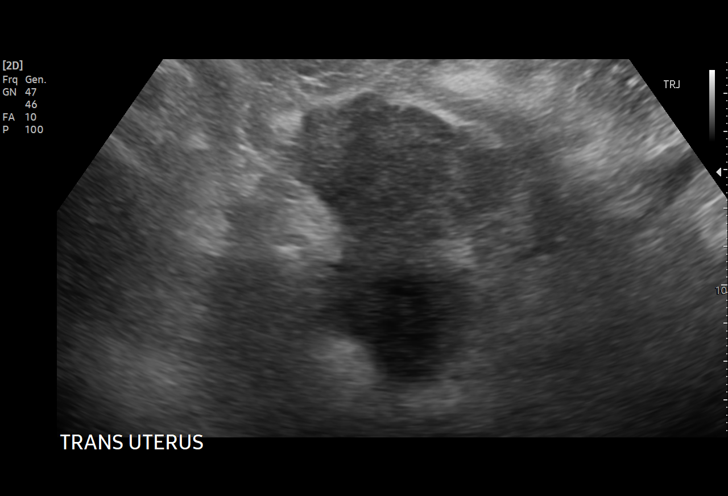
[im 23/109]
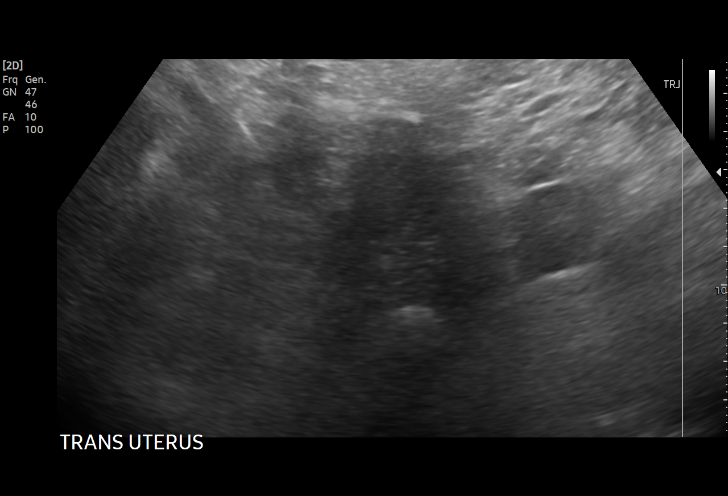
[im 32/109]
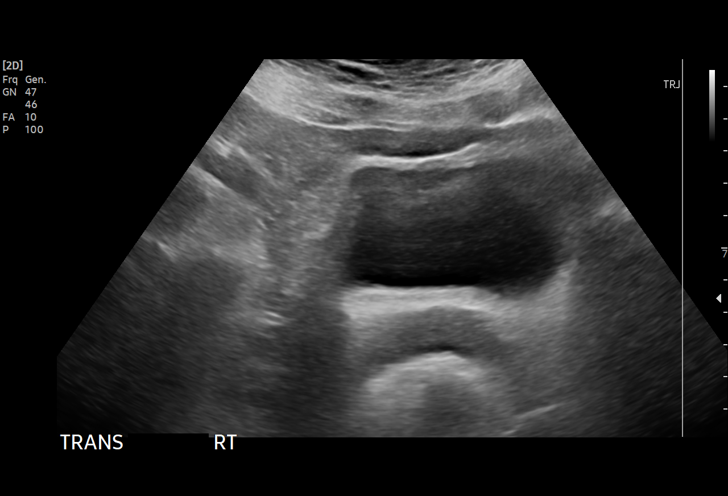
[im 41/109]
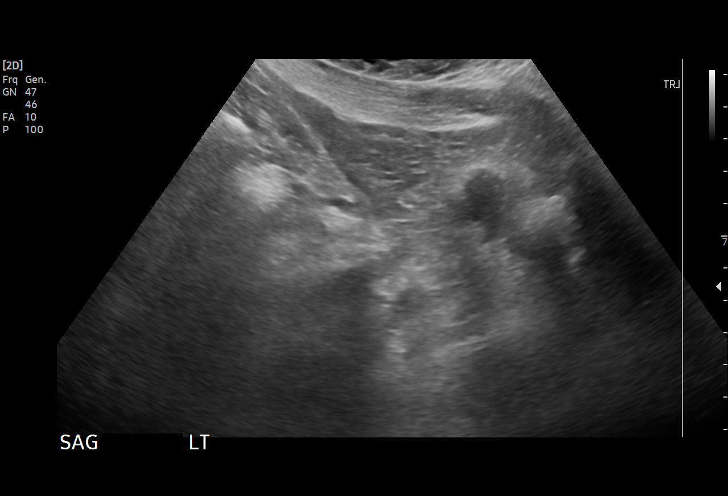
[im 46/109]
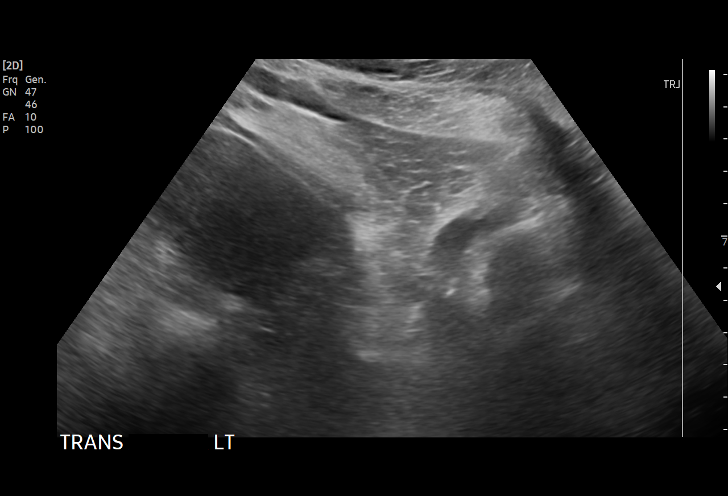
[im 55/109]
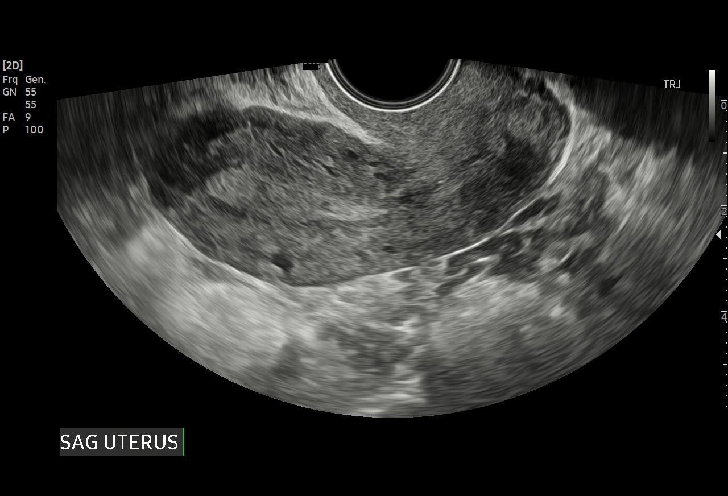
[im 64/109]
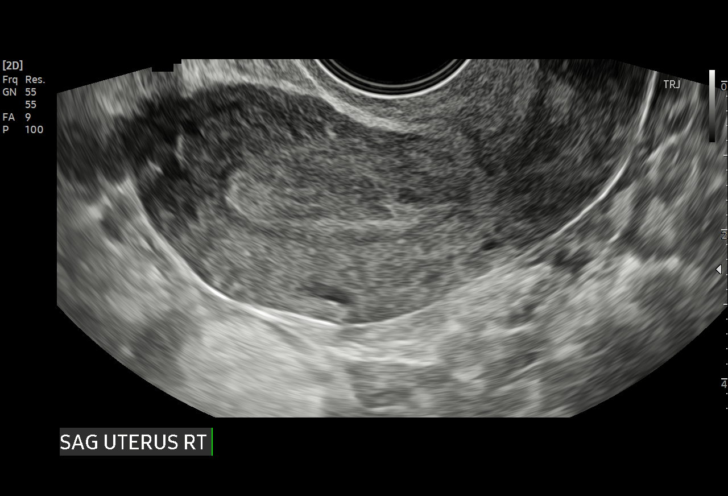
[im 68/109]
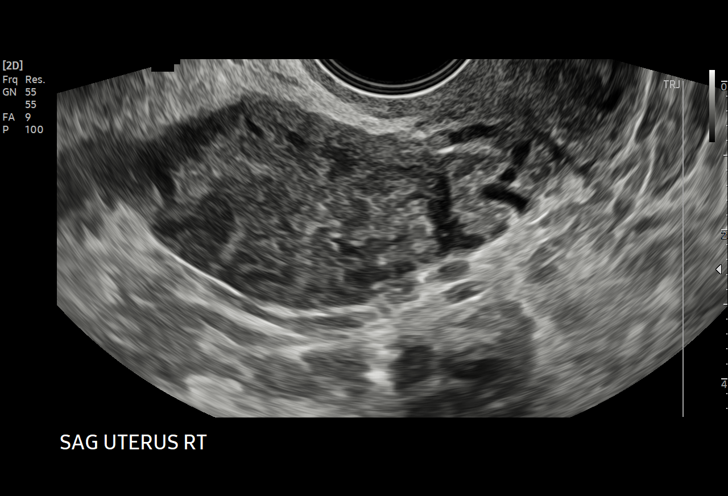
[im 77/109]
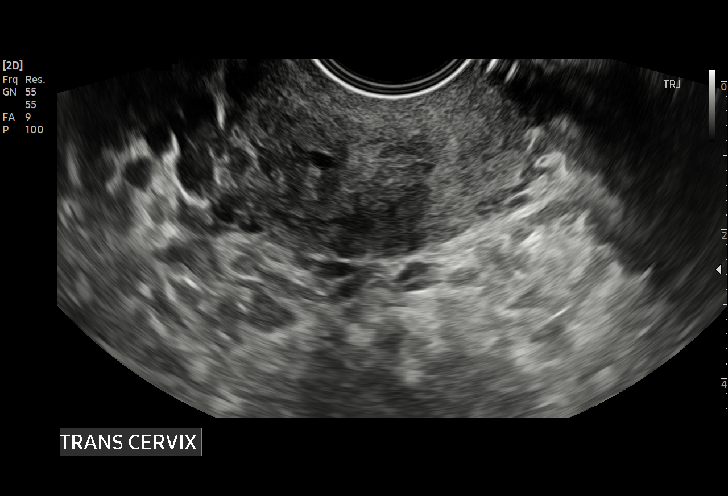
[im 86/109]
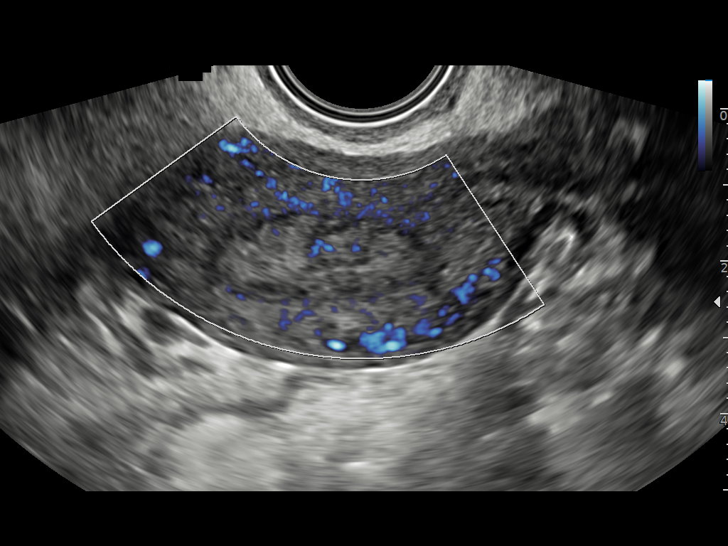
[im 91/109]
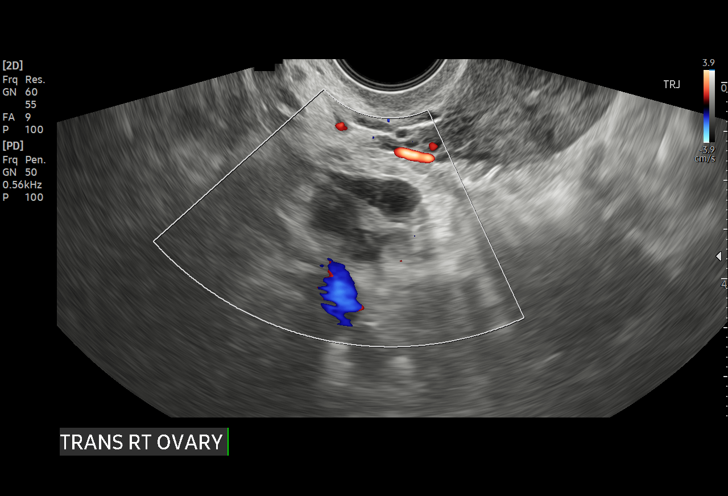
[im 100/109]
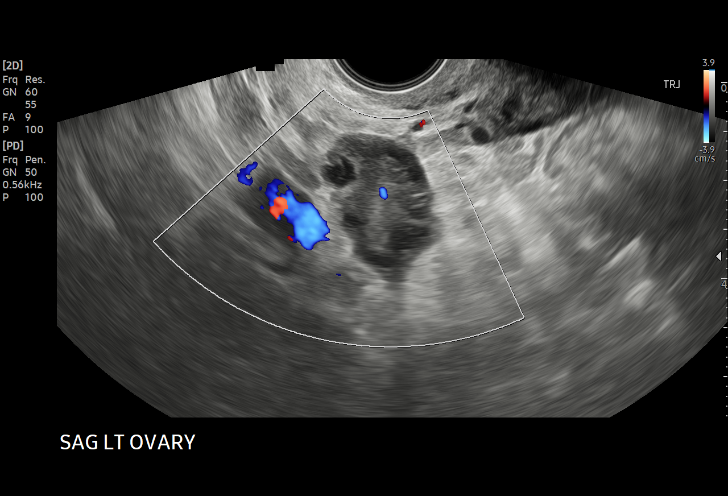
[im 109/109]
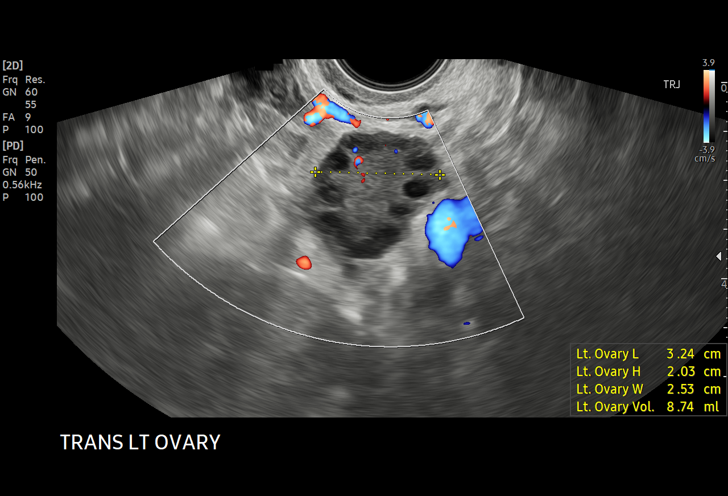

[15 of 25 positions shown; findings below may reference images not displayed]

FINDINGS: Uterus

Measurements: 8.0 x 3.1 x 5.3 cm = volume: 69 mL. Anteverted. Fairly
homogeneous myometrial echogenicity. Questionable subtle mass at
anterior superior RIGHT fundus 10 mm diameter, subserosal, best
visualized in transverse, image 82.

Endometrium

Thickness: 10 mm.  No endometrial fluid or mass

Right ovary

Measurements: 3.8 x 2.2 x 2.4 cm = volume: 10.3 mL. Normal
morphology without mass

Left ovary

Measurements: 3.2 x 2.0 x 2.5 cm = volume: 8.7 mL. Normal morphology
without mass

Other findings

No free pelvic fluid.  No adnexal masses.
IMPRESSION: Normal exam.

## 2023-03-14 ENCOUNTER — Other Ambulatory Visit: Payer: Self-pay

## 2023-03-14 ENCOUNTER — Emergency Department (HOSPITAL_COMMUNITY)
Admission: EM | Admit: 2023-03-14 | Discharge: 2023-03-14 | Disposition: A | Discharge status: Home / Self Care | Payer: Medicaid Other | Attending: Emergency Medicine | Admitting: Emergency Medicine

## 2023-03-14 DIAGNOSIS — N3000 Acute cystitis without hematuria: Secondary | ICD-10-CM | POA: Diagnosis not present

## 2023-03-14 DIAGNOSIS — R112 Nausea with vomiting, unspecified: Secondary | ICD-10-CM | POA: Diagnosis not present

## 2023-03-14 DIAGNOSIS — R1110 Vomiting, unspecified: Secondary | ICD-10-CM

## 2023-03-14 LAB — URINALYSIS, ROUTINE W REFLEX MICROSCOPIC
Bilirubin Urine: NEGATIVE
Glucose, UA: NEGATIVE mg/dL
Hgb urine dipstick: NEGATIVE
Ketones, ur: NEGATIVE mg/dL
Nitrite: NEGATIVE
Protein, ur: 100 mg/dL — AB
Specific Gravity, Urine: 1.032 — ABNORMAL HIGH (ref 1.005–1.030)
Squamous Epithelial / HPF: >50 /HPF (ref 0–5)
pH: 6 (ref 5.0–8.0)

## 2023-03-14 LAB — CBG MONITORING, ED: Glucose-Capillary: 152 mg/dL — ABNORMAL HIGH (ref 70–99)

## 2023-03-14 LAB — PREGNANCY, URINE: Preg Test, Ur: NEGATIVE

## 2023-03-14 MED ORDER — ONDANSETRON 4 MG PO TBDP
4.0000 mg | ORAL_TABLET | Freq: Three times a day (TID) | ORAL | 0 refills | Status: DC | PRN
Start: 2023-03-14 — End: 2024-04-08

## 2023-03-14 MED ORDER — CEPHALEXIN 500 MG PO CAPS: 500.0000 mg | ORAL_CAPSULE | Freq: Four times a day (QID) | ORAL | 0 refills | Status: DC

## 2023-03-14 NOTE — ED Provider Notes (Signed)
Signout from [place] PA-C at shift change. Briefly, patient presents for N/V/D, concern for diabetes.    Plan: Follow-up UA, cbg    5:18 PM Reassessment performed. Patient appears stable. No vomiting. C/o nausea.   Labs and imaging personally reviewed and interpreted including: UA not clean-catch but there are significant white blood cells, pregnancy negative, blood sugar 150.    Most current vital signs reviewed and are as follows: BP (!) 120/96   Pulse 63   Temp 98.3 F (36.8 C) (Oral)   Resp 18   Ht 5\' 5"  (1.651 m)   Wt 113.4 kg   SpO2 98%   BMI 41.60 kg/m   Plan: Discharge.   Home treatment: Prescription for Zofran days for UTI in case symptoms related to this.   Return and follow-up instructions: Encouraged return to ED with persistent vomiting, development of abdominal pain, fever. Encouraged patient to follow-up with their provider in 3 days. Patient verbalized understanding and agreed with plan.        Renne Crigler, PA-C 03/14/23 1720    Elayne Snare K, DO 03/14/23 2315

## 2023-03-14 NOTE — Discharge Instructions (Signed)
Please read and follow all provided instructions.  Your diagnoses today include:  1. Vomiting, unspecified vomiting type, unspecified whether nausea present   2. Acute cystitis without hematuria    Tests performed today include: Pregnancy: negative Urine test: some infection fighting cells noted, questionable for urine infection Blood sugar: mildly elevated at 150 Vital signs. See below for your results today.   Medications prescribed:  Keflex (cephalexin) - antibiotic  You have been prescribed an antibiotic medicine: take the entire course of medicine even if you are feeling better. Stopping early can cause the antibiotic not to work.  Zofran (ondansetron) - for nausea and vomiting  Take any prescribed medications only as directed.  Home care instructions:  Follow any educational materials contained in this packet.  BE VERY CAREFUL not to take multiple medicines containing Tylenol (also called acetaminophen). Doing so can lead to an overdose which can damage your liver and cause liver failure and possibly death.   Follow-up instructions: Please follow-up with your primary care provider in the next 3 days for further evaluation of your symptoms.   Return instructions:  Please return to the Emergency Department if you experience worsening symptoms.  Please return if you have any other emergent concerns.  Additional Information:  Your vital signs today were: BP (!) 120/96   Pulse 63   Temp 98.3 F (36.8 C) (Oral)   Resp 18   Ht 5\' 5"  (1.651 m)   Wt 113.4 kg   SpO2 98%   BMI 41.60 kg/m  If your blood pressure (BP) was elevated above 135/85 this visit, please have this repeated by your doctor within one month. --------------

## 2023-03-14 NOTE — ED Triage Notes (Addendum)
Pt reports feeling fatigued with headaches, body aches, nausea and chills since Friday with congestion. Denies sick exposure, fevers.

## 2023-03-14 NOTE — ED Provider Notes (Signed)
[facility] EMERGENCY DEPARTMENT AT [facility] Provider Note   CSN: [id number] Arrival date & time: 03/14/23  1354     History  Chief Complaint  Patient presents with   viral s/s    Melanie Ball is a 32 y.o. female.  Pt complains of nausea vomiting and diarrhea for the past 4 days.  Patient reports she is not had a normal.  In 2 years.  Patient reports she had a miscarriage and has not had a normal period since.  Patient is concerned that she could be diabetic because her hands and feet numb times become tingly.  The history is provided by the patient. No language interpreter was used.       Home Medications Prior to Admission medications   Medication Sig Start Date End Date Taking? Authorizing Provider  ibuprofen (ADVIL) 600 MG tablet Take 1 tablet (600 mg total) by mouth every 6 (six) hours as needed. Patient not taking: Reported on 03/14/2021 08/29/20   Hermina Staggers, MD      Allergies    Patient has no known allergies.    Review of Systems   Review of Systems  All other systems reviewed and are negative.   Physical Exam Updated Vital Signs BP (!) 150/98   Pulse 71   Temp 98.2 F (36.8 C) (Oral)   Resp 18   Ht 5\' 5"  (1.651 m)   Wt 113.4 kg   SpO2 99%   BMI 41.60 kg/m  Physical Exam Vitals and nursing note reviewed.  Constitutional:      Appearance: She is well-developed.  HENT:     Head: Normocephalic.     Right Ear: Tympanic membrane normal.     Left Ear: Tympanic membrane normal.     Nose: Nose normal.     Mouth/Throat:     Mouth: Mucous membranes are moist.  Cardiovascular:     Rate and Rhythm: Normal rate.  Pulmonary:     Effort: Pulmonary effort is normal.  Abdominal:     General: There is no distension.  Musculoskeletal:        General: Normal range of motion.     Cervical back: Normal range of motion.  Skin:    General: Skin is warm.  Neurological:     General: No focal deficit present.     Mental Status: She is  alert and oriented to person, place, and time.     ED Results / Procedures / Treatments   Labs (all labs ordered are listed, but only abnormal results are displayed) Labs Reviewed  URINALYSIS, ROUTINE W REFLEX MICROSCOPIC  PREGNANCY, URINE  CBG MONITORING, ED    EKG None  Radiology No results found.  Procedures Procedures    Medications Ordered in ED Medications - No data to display  ED Course/ Medical Decision Making/ A&P                             Medical Decision Making Patient complains of nausea vomiting and diarrhea  Amount and/or Complexity of Data Reviewed Labs: ordered. Decision-making details documented in ED Course.    Details: Labs ordered reviewed and interpreted           Final Clinical Impression(s) / ED Diagnoses Final diagnoses:  Vomiting, unspecified vomiting type, unspecified whether nausea present    Rx / DC Orders ED Discharge Orders     None         [place],  Lonia Skinner, PA-C 03/14/23 1455    Lorre Nick, MD 03/15/23 [phone]

## 2023-05-15 ENCOUNTER — Encounter: Payer: Self-pay | Admitting: Advanced Practice Midwife

## 2023-05-15 ENCOUNTER — Ambulatory Visit (INDEPENDENT_AMBULATORY_CARE_PROVIDER_SITE_OTHER): Payer: Medicaid Other | Admitting: Advanced Practice Midwife

## 2023-05-15 ENCOUNTER — Other Ambulatory Visit (HOSPITAL_COMMUNITY)
Admission: RE | Admit: 2023-05-15 | Discharge: 2023-05-15 | Disposition: A | Discharge status: Home / Self Care | Payer: Medicaid Other | Source: Ambulatory Visit | Attending: Advanced Practice Midwife | Admitting: Advanced Practice Midwife

## 2023-05-15 VITALS — BP 115/78 | HR 70 | Ht 65.0 in | Wt 239.7 lb

## 2023-05-15 DIAGNOSIS — N914 Secondary oligomenorrhea: Secondary | ICD-10-CM

## 2023-05-15 DIAGNOSIS — Z124 Encounter for screening for malignant neoplasm of cervix: Secondary | ICD-10-CM | POA: Diagnosis present

## 2023-05-15 DIAGNOSIS — N926 Irregular menstruation, unspecified: Secondary | ICD-10-CM | POA: Diagnosis not present

## 2023-05-15 DIAGNOSIS — Z6839 Body mass index (BMI) 39.0-39.9, adult: Secondary | ICD-10-CM

## 2023-05-15 DIAGNOSIS — N970 Female infertility associated with anovulation: Secondary | ICD-10-CM

## 2023-05-15 DIAGNOSIS — Z113 Encounter for screening for infections with a predominantly sexual mode of transmission: Secondary | ICD-10-CM

## 2023-05-15 DIAGNOSIS — Z1339 Encounter for screening examination for other mental health and behavioral disorders: Secondary | ICD-10-CM | POA: Diagnosis not present

## 2023-05-15 DIAGNOSIS — Z01419 Encounter for gynecological examination (general) (routine) without abnormal findings: Secondary | ICD-10-CM

## 2023-05-15 MED ORDER — MEDROXYPROGESTERONE ACETATE 10 MG PO TABS: 10.0000 mg | ORAL_TABLET | Freq: Every day | ORAL | 3 refills | Status: DC

## 2023-05-15 NOTE — Progress Notes (Signed)
Subjective:     Melanie Ball is a 32 y.o. female here at [facility] for a routine exam.  Current complaints: irregular periods, light spotting only, skipping months at a time.  Pt desires pregnancy at this time.  She reports regular menses when she was younger, and after her first pregnancy, but since her 20 week delivery with loss she reports irregular cycles.  She reports significant weight gain as well during that time.   Personal health history reviewed: yes.  Do you have a primary care provider? yes Do you feel safe at home? yes  Flowsheet Row Office Visit from 05/15/2023 in [facility] for [facility] Total Score 2       Health Maintenance Due  Topic Date Due   DTaP/Tdap/Td (1 - Tdap) Never done   INFLUENZA VACCINE  Never done   COVID-19 Vaccine (1 - 2023-24 season) Never done     Risk factors for chronic health problems: Smoking: Alchohol/how much: Pt BMI: Body mass index is 39.89 kg/m.   Gynecologic History No LMP recorded (lmp unknown). Contraception: none Last Pap: 07/09/2020. Results were: normal Last mammogram: n/a.   Obstetric History OB History  Gravida Para Term Preterm AB Living  2 1 1  0 1 1  SAB IAB Ectopic Multiple Live Births        0 1    # Outcome Date GA Lbr Len/2nd Weight Sex Type Anes PTL Lv  2 AB 08/28/20 [redacted]w[redacted]d    Vag-Spont     1 Term 02/09/20 [redacted]w[redacted]d 15:04 / 02:32 8 lb 8.5 oz (3.87 kg) M Vag-Spont EPI  LIV     The following portions of the patient's history were reviewed and updated as appropriate: allergies, current medications, past family history, past medical history, past social history, past surgical history, and problem list.  Review of Systems Pertinent items noted in HPI and remainder of comprehensive ROS otherwise negative.    Objective:  BP 115/78   Pulse 70   Ht 5\' 5"  (1.651 m)   Wt 239 lb 11.2 oz (108.7 kg)   LMP  (LMP Unknown)   Breastfeeding No   BMI 39.89 kg/m   VS reviewed,  nursing note reviewed,  Constitutional: well developed, well nourished, no distress HEENT: normocephalic, thyroid without enlargement or mass HEART: RRR, no murmurs rubs/gallops RESP: clear and equal to auscultation bilaterally in all lobes  Breast Exam: exam performed: right breast normal without mass, skin or nipple changes or axillary nodes, left breast normal without mass, skin or nipple changes or axillary nodes Abdomen: soft Neuro: alert and oriented x 3 Skin: warm, dry Psych: affect normal Pelvic exam: Performed: Cervix pink, visually closed, without lesion, scant white creamy discharge, vaginal walls and external genitalia normal Bimanual exam: Cervix 0/long/high, firm, anterior, neg CMT, uterus nontender, nonenlarged, adnexa without tenderness, enlargement, or mass        Assessment/Plan:   1. Encounter for screening for cervical cancer  - Cytology - PAP  2. Screening examination for STI  - Cytology - PAP - Hepatitis B surface antigen - Hepatitis C antibody - RPR - HIV Antibody (routine testing w rflx)  3. Irregular periods   4. Infertility associated with anovulation --Likely PCOS, but hx of regular menses not classic.  --Weight gain may be contributing. --Discussed increasing exercise/walking and healthy diet/low carb --Discussed options with patient including diet alone, medication to regulate periods, and/or medications to stimulate ovulation.   --Pt to try Provera progesterone  challenge, then attempt conception around Day 14 of her cycle.  F/U in office in 3 months.   - medroxyPROGESTERone (PROVERA) 10 MG tablet; Take 1 tablet (10 mg total) by mouth daily.  Dispense: 10 tablet; Refill: 3  5. Secondary oligomenorrhea --If no improvement with Provera, consider lab testing/ultrasound - medroxyPROGESTERone (PROVERA) 10 MG tablet; Take 1 tablet (10 mg total) by mouth daily.  Dispense: 10 tablet; Refill: 3  6. BMI 39.0-39.9,adult    7. Encounter for annual  routine gynecological examination     Return in about 3 months (around 08/14/2023) for Gyn follow up for Abnormal Uterine Bleeding, with me if possible.   Sharen Counter, CNM 12:31 PM

## 2023-05-16 LAB — HEPATITIS C ANTIBODY: Hep C Virus Ab: NONREACTIVE

## 2023-05-16 LAB — HEPATITIS B SURFACE ANTIGEN: Hepatitis B Surface Ag: NEGATIVE

## 2023-05-16 LAB — RPR: RPR Ser Ql: NONREACTIVE

## 2023-05-16 LAB — HIV ANTIBODY (ROUTINE TESTING W REFLEX): HIV Screen 4th Generation wRfx: NONREACTIVE

## 2023-05-17 LAB — CYTOLOGY - PAP
Chlamydia: NEGATIVE
Comment: NEGATIVE
Comment: NEGATIVE
Comment: NEGATIVE
Comment: NORMAL
Diagnosis: NEGATIVE
Diagnosis: REACTIVE
High risk HPV: NEGATIVE
Neisseria Gonorrhea: NEGATIVE
Trichomonas: NEGATIVE

## 2023-05-20 ENCOUNTER — Encounter: Payer: Self-pay | Admitting: Advanced Practice Midwife

## 2023-05-22 ENCOUNTER — Other Ambulatory Visit: Payer: Self-pay | Admitting: Advanced Practice Midwife

## 2023-05-22 MED ORDER — FLUCONAZOLE 150 MG PO TABS: ORAL_TABLET | ORAL | 0 refills | Status: DC

## 2023-06-06 ENCOUNTER — Encounter: Payer: Self-pay | Admitting: Advanced Practice Midwife

## 2024-03-04 ENCOUNTER — Ambulatory Visit

## 2024-03-04 VITALS — BP 139/89 | HR 79

## 2024-03-04 DIAGNOSIS — Z3201 Encounter for pregnancy test, result positive: Secondary | ICD-10-CM | POA: Diagnosis not present

## 2024-03-04 LAB — POCT URINE PREGNANCY: Preg Test, Ur: POSITIVE — AB

## 2024-03-04 MED ORDER — PRENATAL 28-0.8 MG PO TABS: 1.0000 | ORAL_TABLET | Freq: Every day | ORAL | 12 refills | Status: AC

## 2024-03-04 NOTE — Progress Notes (Signed)
 Melanie Ball here for a UPT. Pt had a positive upt at home. LMP is 12/25/23.     UPT in office Positive.    Reviewed medications and informed to start a PNV, if not already. Pt to follow up in 1 week for New OB Intake visit.

## 2024-03-10 ENCOUNTER — Ambulatory Visit: Admitting: *Deleted

## 2024-03-10 ENCOUNTER — Other Ambulatory Visit (INDEPENDENT_AMBULATORY_CARE_PROVIDER_SITE_OTHER): Payer: Self-pay

## 2024-03-10 VITALS — BP 126/79 | HR 63 | Wt 240.4 lb

## 2024-03-10 DIAGNOSIS — O3680X0 Pregnancy with inconclusive fetal viability, not applicable or unspecified: Secondary | ICD-10-CM

## 2024-03-10 DIAGNOSIS — O0990 Supervision of high risk pregnancy, unspecified, unspecified trimester: Secondary | ICD-10-CM | POA: Insufficient documentation

## 2024-03-10 DIAGNOSIS — Z3A01 Less than 8 weeks gestation of pregnancy: Secondary | ICD-10-CM | POA: Diagnosis not present

## 2024-03-10 DIAGNOSIS — Z1331 Encounter for screening for depression: Secondary | ICD-10-CM

## 2024-03-10 DIAGNOSIS — O0991 Supervision of high risk pregnancy, unspecified, first trimester: Secondary | ICD-10-CM

## 2024-03-10 MED ORDER — BLOOD PRESSURE KIT DEVI: 1.0000 | 0 refills | Status: DC

## 2024-03-10 NOTE — Progress Notes (Signed)
 New OB Intake  I connected with Melanie Ball  on 03/10/24 at  9:15 AM EDT by In Person Visit and verified that I am speaking with the correct person using two identifiers. Nurse is located at [facility] and pt is located at [place].  I discussed the limitations, risks, security and privacy concerns of performing an evaluation and management service by telephone and the availability of in person appointments. I also discussed with the patient that there may be a patient responsible charge related to this service. The patient expressed understanding and agreed to proceed.  I explained I am completing New OB Intake today. We discussed EDD of 10/28/24 based on US  at [redacted]w[redacted]d weeks. Pt is G3P1011. I reviewed her allergies, medications and Medical/Surgical/OB history.    Patient Active Problem List   Diagnosis Date Noted   History of cervical incompetence 12/07/2020   Premature cervical dilation in second trimester 08/27/2020   Obesity 02/09/2020     Concerns addressed today  Delivery Plans Plans to deliver at [facility]. Discussed the nature of our practice with multiple providers including residents and students. Due to the size of the practice, the delivering provider may not be the same as those providing prenatal care.   Patient Not Asked interested in water birth.  MyChart/Babyscripts MyChart access verified. I explained pt will have some visits in office and some virtually. Babyscripts instructions given and order placed. Patient verifies receipt of registration text/e-mail. Account successfully created and app downloaded. If patient is a candidate for Optimized scheduling, add to sticky note.   Blood Pressure Cuff/Weight Scale Blood pressure cuff ordered for patient to pick-up from [employer]. Explained after first prenatal appt pt will check weekly and document in Babyscripts. Patient does not have weight scale; patient may purchase if they desire to track weight weekly in  Babyscripts.  Anatomy US  Explained first scheduled US  will be around 19 weeks. Anatomy US  scheduled for TBD at TBD.  Is patient a candidate for Babyscripts Optimization? No, due to Risk Factors   First visit review I reviewed new OB appt with patient. Explained pt will be seen by Olam Boards, CNM at first visit. Discussed [place] genetic screening with patient. Requests Panorama. Routine prenatal labs OB Urine only collected at today's visit. Initial OB labs deferred to New OB appt.  Last Pap Diagnosis  Date Value Ref Range Status  05/15/2023   Final   - Negative for Intraepithelial Lesions or Malignancy (NILM)  05/15/2023 - Benign reactive/reparative changes  Final    Melanie CHRISTELLA Ober, RN 03/10/2024  9:16 AM

## 2024-03-10 NOTE — Patient Instructions (Signed)

## 2024-03-12 LAB — URINE CULTURE, OB REFLEX

## 2024-03-12 LAB — CULTURE, OB URINE

## 2024-03-18 ENCOUNTER — Encounter: Admitting: Advanced Practice Midwife

## 2024-04-07 ENCOUNTER — Ambulatory Visit: Admitting: Advanced Practice Midwife

## 2024-04-07 ENCOUNTER — Encounter: Payer: Self-pay | Admitting: Advanced Practice Midwife

## 2024-04-07 ENCOUNTER — Other Ambulatory Visit (HOSPITAL_COMMUNITY)
Admission: RE | Admit: 2024-04-07 | Discharge: 2024-04-07 | Disposition: A | Discharge status: Home / Self Care | Source: Ambulatory Visit | Attending: Advanced Practice Midwife | Admitting: Advanced Practice Midwife

## 2024-04-07 VITALS — BP 113/66 | HR 83 | Wt 250.0 lb

## 2024-04-07 DIAGNOSIS — Z8742 Personal history of other diseases of the female genital tract: Secondary | ICD-10-CM | POA: Diagnosis not present

## 2024-04-07 DIAGNOSIS — Z3A10 10 weeks gestation of pregnancy: Secondary | ICD-10-CM

## 2024-04-07 DIAGNOSIS — O26899 Other specified pregnancy related conditions, unspecified trimester: Secondary | ICD-10-CM

## 2024-04-07 DIAGNOSIS — O26891 Other specified pregnancy related conditions, first trimester: Secondary | ICD-10-CM | POA: Diagnosis not present

## 2024-04-07 DIAGNOSIS — Z1331 Encounter for screening for depression: Secondary | ICD-10-CM | POA: Diagnosis not present

## 2024-04-07 DIAGNOSIS — O0990 Supervision of high risk pregnancy, unspecified, unspecified trimester: Secondary | ICD-10-CM | POA: Insufficient documentation

## 2024-04-07 DIAGNOSIS — N939 Abnormal uterine and vaginal bleeding, unspecified: Secondary | ICD-10-CM

## 2024-04-07 NOTE — Progress Notes (Signed)
 Pt presents for new ob. Pt states that she has been having very light pink spotting after intercourse. Pt would like something for nausea. Pt states that she is feeling a poking sensation in her lower abdomen area. Pt states that she had the same feeling when she miscarried. Pt PHQ-9 is 12/ GAD-7 is 12. Pt does not want to talk to anyone.

## 2024-04-07 NOTE — Progress Notes (Addendum)
 Subjective:   Melanie Ball is a 33 y.o. G3P1101 at [redacted]w[redacted]d by early ultrasound being seen today for her first obstetrical visit.  Her obstetrical history is significant for cervical incompetence and has Obesity; Premature cervical dilation in second trimester; History of cervical incompetence; and Supervision of high risk pregnancy, antepartum on their problem list.. Patient does intend to breast feed. Pregnancy history fully reviewed.  Patient reports mild abdominal cramping, describes as a poking sensation. She is concerned that this feels similar to cramping with previous loss. Also reports vaginal spotting. Denies vaginal itching, vaginal odor, dysuria, incontinence, or urinary frequency.  HISTORY: OB History  Gravida Para Term Preterm AB Living  3 2 1 1  0 1  SAB IAB Ectopic Multiple Live Births  0 0 0 0 1    # Outcome Date GA Lbr Len/2nd Weight Sex Type Anes PTL Lv  3 Current           2 Preterm 08/28/20 [redacted]w[redacted]d    Vag-Spont   FD  1 Term 02/09/20 [redacted]w[redacted]d 15:04 / 02:32 3870 g M Vag-Spont EPI  LIV     Name: GUERERO,BOY Elvis     Apgar1: 9  Apgar5: 9   Past Medical History:  Diagnosis Date   Anxiety    BMI 40.0-44.9, adult (HCC)    Chlamydia infection affecting pregnancy in second trimester 08/16/2020   Depression    Group B Streptococcus urinary tract infection affecting pregnancy in second trimester 08/16/2020   Less than 10,000 colonies, needs prophylaxis in labor    History of COVID-19 08/16/2020   +COVID at [facility] 09/11/19   Past Surgical History:  Procedure Laterality Date   NO PAST SURGERIES     No family history on file. Social History   Tobacco Use   Smoking status: Former    Current packs/day: 0.00    Types: Cigarettes    Quit date: 02/28/2019    Years since quitting: 5.1   Smokeless tobacco: Never  Vaping Use   Vaping status: Former  Substance Use Topics   Alcohol use: Not Currently    Comment: about 1x per weel   Drug use: Not Currently   No  Known Allergies Current Outpatient Medications on File Prior to Visit  Medication Sig Dispense Refill   Blood Pressure Monitoring (BLOOD PRESSURE KIT) DEVI 1 Device by Does not apply route once a week. 1 each 0   Prenatal 28-0.8 MG TABS Take 1 tablet by mouth daily. 30 tablet 12   cephALEXin  (KEFLEX ) 500 MG capsule Take 1 capsule (500 mg total) by mouth 4 (four) times daily. (Patient not taking: Reported on 04/07/2024) 20 capsule 0   fluconazole  (DIFLUCAN ) 150 MG tablet Take one tablet now, and one in 3 days (Patient not taking: Reported on 04/07/2024) 2 tablet 0   FLUoxetine (PROZAC) 40 MG capsule Take 40 mg by mouth every morning. (Patient not taking: Reported on 04/07/2024)     hydrOXYzine (ATARAX) 25 MG tablet Take 25 mg by mouth at bedtime. (Patient not taking: Reported on 04/07/2024)     ibuprofen  (ADVIL ) 600 MG tablet Take 1 tablet (600 mg total) by mouth every 6 (six) hours as needed. (Patient not taking: Reported on 04/07/2024) 30 tablet 0   medroxyPROGESTERone  (PROVERA ) 10 MG tablet Take 1 tablet (10 mg total) by mouth daily. (Patient not taking: Reported on 04/07/2024) 10 tablet 3   ondansetron  (ZOFRAN -ODT) 4 MG disintegrating tablet Take 1 tablet (4 mg total) by mouth every 8 (eight) hours  as needed for nausea or vomiting. (Patient not taking: Reported on 04/07/2024) 10 tablet 0   No current facility-administered medications on file prior to visit.   Exam   Vitals:   04/07/24 0923  BP: 113/66  Pulse: 83  Weight: 113.4 kg   Fetal Heart Rate (bpm): 166  VS reviewed, nursing note reviewed,  Constitutional: well developed, well nourished, no distress HEENT: normocephalic CV: normal rate Pulm/chest wall: normal effort Abdomen: soft Pelvic: bimanual exam by Olam, CNM finds cervix thick and closed Neuro: alert and oriented x 3 Skin: warm, dry Psych: affect normal  Assessment:   Pregnancy: [id number] Patient Active Problem List   Diagnosis Date Noted   Supervision of high risk  pregnancy, antepartum 03/10/2024   History of cervical incompetence 12/07/2020   Premature cervical dilation in second trimester 08/27/2020   Obesity 02/09/2020     Plan:  1. Supervision of high risk pregnancy, antepartum (Primary) Feeling well overall, planning routine labs today with NIPS, carrier screening negative in prior pregnancy - CBC/D/Plt+RPR+Rh+ABO+RubIgG... - HgB A1c - PANORAMA PRENATAL TEST  2. [redacted] weeks gestation of pregnancy   3. History of cervical incompetence 2021: full term delivery at [redacted]w[redacted]d 2022: cervical dilation seen on anatomy US  at 20 weeks, patient admitted to hospital, and delivered the following day Plan to follow up with MFM to discuss management this pregnancy - AMB referral to maternal fetal medicine  4. Abdominal cramping affecting pregnancy No cervical dilation on exam today, will check for infection - Cervicovaginal ancillary only( [place]) - Culture, OB Urine   5. Vaginal spotting See above   Initial labs drawn. Continue prenatal vitamins. Discussed and offered genetic screening options, including Quad screen/AFP, NIPS testing, and option to decline testing. Benefits/risks/alternatives reviewed. Pt aware that anatomy US  is form of genetic screening with lower accuracy in detecting trisomies than blood work.  Pt chooses NIPS today. Ultrasound discussed; fetal anatomic survey: ordered. Problem list reviewed and updated. The nature of [facility] with multiple MDs and other Advanced Practice Providers was explained to patient; also emphasized that residents, students are part of our team. Routine obstetric precautions reviewed. Return in about 4 weeks (around 05/05/2024) for ROB.   Vernell FORBES Ruddle, Student-MidWife 04/07/24 5:51 PM  Midwife Attestation:  I personally saw and evaluated the patient, performing the key elements of the service. I developed and verified the management plan that is  described in the resident's/student's note, and I agree with the content with my edits above. VSS, HRR&R, Resp unlabored, Legs neg.    Olam Boards, CNM 7:15 PM

## 2024-04-08 ENCOUNTER — Encounter: Payer: Self-pay | Admitting: Obstetrics and Gynecology

## 2024-04-08 ENCOUNTER — Other Ambulatory Visit: Payer: Self-pay | Admitting: Advanced Practice Midwife

## 2024-04-08 DIAGNOSIS — O219 Vomiting of pregnancy, unspecified: Secondary | ICD-10-CM

## 2024-04-08 LAB — CERVICOVAGINAL ANCILLARY ONLY
Bacterial Vaginitis (gardnerella): NEGATIVE
Candida Glabrata: NEGATIVE
Candida Vaginitis: POSITIVE — AB
Chlamydia: NEGATIVE
Comment: NEGATIVE
Comment: NEGATIVE
Comment: NEGATIVE
Comment: NEGATIVE
Comment: NEGATIVE
Comment: NORMAL
Neisseria Gonorrhea: NEGATIVE
Trichomonas: NEGATIVE

## 2024-04-08 LAB — CBC/D/PLT+RPR+RH+ABO+RUBIGG...
Antibody Screen: NEGATIVE
Basophils Absolute: 0 x10E3/uL (ref 0.0–0.2)
Basos: 1 %
EOS (ABSOLUTE): 0.1 x10E3/uL (ref 0.0–0.4)
Eos: 1 %
HCV Ab: NONREACTIVE
HIV Screen 4th Generation wRfx: NONREACTIVE
Hematocrit: 40.5 % (ref 34.0–46.6)
Hemoglobin: 13.3 g/dL (ref 11.1–15.9)
Hepatitis B Surface Ag: NEGATIVE
Immature Grans (Abs): 0 x10E3/uL (ref 0.0–0.1)
Immature Granulocytes: 0 %
Lymphocytes Absolute: 1.5 x10E3/uL (ref 0.7–3.1)
Lymphs: 25 %
MCH: 31.7 pg (ref 26.6–33.0)
MCHC: 32.8 g/dL (ref 31.5–35.7)
MCV: 96 fL (ref 79–97)
Monocytes Absolute: 0.5 x10E3/uL (ref 0.1–0.9)
Monocytes: 9 %
Neutrophils Absolute: 3.9 x10E3/uL (ref 1.4–7.0)
Neutrophils: 64 %
Platelets: 267 x10E3/uL (ref 150–450)
RBC: 4.2 x10E6/uL (ref 3.77–5.28)
RDW: 14.2 % (ref 11.7–15.4)
RPR Ser Ql: NONREACTIVE
Rh Factor: POSITIVE
Rubella Antibodies, IGG: 3.64 {index} (ref >0.99)
WBC: 6.1 x10E3/uL (ref 3.4–10.8)

## 2024-04-08 LAB — HEMOGLOBIN A1C
Est. average glucose Bld gHb Est-mCnc: 126 mg/dL
Hgb A1c MFr Bld: 6 % — ABNORMAL HIGH (ref 4.8–5.6)

## 2024-04-08 LAB — HCV INTERPRETATION

## 2024-04-08 MED ORDER — DOXYLAMINE-PYRIDOXINE 10-10 MG PO TBEC: DELAYED_RELEASE_TABLET | ORAL | 5 refills | Status: AC

## 2024-04-08 NOTE — Progress Notes (Signed)
 Rx sent for Diclegis  for n/v of pregnancy at pt request.

## 2024-04-09 ENCOUNTER — Telehealth: Payer: Self-pay

## 2024-04-09 NOTE — Telephone Encounter (Signed)
 PA submitted for Diclegis  rx

## 2024-04-11 DIAGNOSIS — O09899 Supervision of other high risk pregnancies, unspecified trimester: Secondary | ICD-10-CM | POA: Insufficient documentation

## 2024-04-11 NOTE — Addendum Note (Signed)
 Addended by: MILLY PLANAS A on: 04/11/2024 01:07 PM   Modules accepted: Orders

## 2024-04-11 NOTE — Addendum Note (Signed)
 Addended by: MILLY PLANAS A on: 04/11/2024 01:10 PM   Modules accepted: Orders

## 2024-04-12 ENCOUNTER — Ambulatory Visit: Payer: Self-pay | Admitting: Advanced Practice Midwife

## 2024-04-12 DIAGNOSIS — B3731 Acute candidiasis of vulva and vagina: Secondary | ICD-10-CM

## 2024-04-12 LAB — PANORAMA PRENATAL TEST FULL PANEL:PANORAMA TEST PLUS 5 ADDITIONAL MICRODELETIONS: FETAL FRACTION: 4.4

## 2024-04-12 MED ORDER — TERCONAZOLE 0.8 % VA CREA: 1.0000 | TOPICAL_CREAM | Freq: Every day | VAGINAL | 0 refills | Status: DC

## 2024-04-14 ENCOUNTER — Encounter

## 2024-04-25 ENCOUNTER — Ambulatory Visit: Attending: Obstetrics and Gynecology | Admitting: Obstetrics

## 2024-04-25 ENCOUNTER — Other Ambulatory Visit: Payer: Self-pay | Admitting: *Deleted

## 2024-04-25 ENCOUNTER — Ambulatory Visit (HOSPITAL_BASED_OUTPATIENT_CLINIC_OR_DEPARTMENT_OTHER)

## 2024-04-25 VITALS — BP 122/79

## 2024-04-25 DIAGNOSIS — O99211 Obesity complicating pregnancy, first trimester: Secondary | ICD-10-CM

## 2024-04-25 DIAGNOSIS — O3431 Maternal care for cervical incompetence, first trimester: Secondary | ICD-10-CM | POA: Insufficient documentation

## 2024-04-25 DIAGNOSIS — Z3A13 13 weeks gestation of pregnancy: Secondary | ICD-10-CM | POA: Insufficient documentation

## 2024-04-25 DIAGNOSIS — O0990 Supervision of high risk pregnancy, unspecified, unspecified trimester: Secondary | ICD-10-CM

## 2024-04-25 DIAGNOSIS — O09291 Supervision of pregnancy with other poor reproductive or obstetric history, first trimester: Secondary | ICD-10-CM | POA: Insufficient documentation

## 2024-04-25 DIAGNOSIS — O09292 Supervision of pregnancy with other poor reproductive or obstetric history, second trimester: Secondary | ICD-10-CM

## 2024-04-25 DIAGNOSIS — O09899 Supervision of other high risk pregnancies, unspecified trimester: Secondary | ICD-10-CM

## 2024-04-25 DIAGNOSIS — Z8742 Personal history of other diseases of the female genital tract: Secondary | ICD-10-CM

## 2024-04-25 DIAGNOSIS — E669 Obesity, unspecified: Secondary | ICD-10-CM | POA: Diagnosis not present

## 2024-04-25 NOTE — Progress Notes (Signed)
 MFM Consult Note  Melanie Ball is currently at 13 weeks and 3 days.  She was seen due to a prior previable birth at 20 weeks and 1 day in 2022 due to advanced cervical dilatation.  She also has a history of a prior full-term delivery at 41+ weeks.  The patient denies any prior surgeries to her cervix.    She reports that in the pregnancy in 2022 that resulted in the previable birth, she began to experience lower abdominal cramping starting at 12 to 13 weeks. She denies feeling any lower abdominal cramping in her current pregnancy.  She had a cell free DNA test drawn earlier in her pregnancy which indicated a low risk for trisomy 19, 69, and 13.  A female fetus is predicted.  On today's exam, a viable singleton intrauterine gestation was noted with a crown-rump length consistent with an [facility] of October 28, 2024, making her 13 weeks and 3 days pregnant today, was noted.  Her cervix appeared within normal limits.  Due to her history, a transvaginal cervical length measurement was scheduled in our office at 16 weeks.  We will then perform another transvaginal ultrasound at 18 weeks to assess the cervical length.    Should progressive cervical shortening be noted during her future ultrasound exams, a cervical cerclage or vaginal progesterone may be recommended. She already has a detailed fetal anatomy scan scheduled on June 11, 2024.    The patient was advised that hopefully as she is no longer feeling not feeling any lower abdominal cramping in her current pregnancy, her pregnancy outcome will be improved in this pregnancy.  The patient stated that all of her questions were answered today.  A total of 30 minutes was spent counseling and coordinating the care for this patient.  Greater than 50% of the time was spent in direct face-to-face contact.

## 2024-05-05 ENCOUNTER — Other Ambulatory Visit

## 2024-05-05 ENCOUNTER — Encounter: Admitting: Advanced Practice Midwife

## 2024-05-05 ENCOUNTER — Encounter: Payer: Self-pay | Admitting: Obstetrics and Gynecology

## 2024-05-05 ENCOUNTER — Ambulatory Visit (INDEPENDENT_AMBULATORY_CARE_PROVIDER_SITE_OTHER): Admitting: Obstetrics and Gynecology

## 2024-05-05 VITALS — BP 120/79 | HR 83 | Wt 257.0 lb

## 2024-05-05 DIAGNOSIS — O99210 Obesity complicating pregnancy, unspecified trimester: Secondary | ICD-10-CM | POA: Diagnosis not present

## 2024-05-05 DIAGNOSIS — Z3A15 15 weeks gestation of pregnancy: Secondary | ICD-10-CM

## 2024-05-05 DIAGNOSIS — Z8742 Personal history of other diseases of the female genital tract: Secondary | ICD-10-CM | POA: Diagnosis not present

## 2024-05-05 DIAGNOSIS — O0990 Supervision of high risk pregnancy, unspecified, unspecified trimester: Secondary | ICD-10-CM

## 2024-05-05 MED ORDER — ASPIRIN 81 MG PO TBEC: 81.0000 mg | DELAYED_RELEASE_TABLET | Freq: Every day | ORAL | 2 refills | Status: AC

## 2024-05-05 NOTE — Progress Notes (Signed)
 Pt presents for rob. Pt has no questions or concerns at this time.

## 2024-05-05 NOTE — Progress Notes (Addendum)
   PRENATAL VISIT NOTE  Subjective:  Majesty Stehlin is a 33 y.o. G3P1101 at [redacted]w[redacted]d being seen today for ongoing prenatal care.  She is currently monitored for the following issues for this high-risk pregnancy and has Obesity; Premature cervical dilation in second trimester; History of cervical incompetence; Supervision of high risk pregnancy, antepartum; History of preterm delivery, currently pregnant-fetal demise; and Maternal obesity syndrome, antepartum on their problem list.  Patient reports no complaints.  Contractions: Not present. Vag. Bleeding: None.  Movement: Present. Denies leaking of fluid.   The following portions of the patient's history were reviewed and updated as appropriate: allergies, current medications, past family history, past medical history, past social history, past surgical history and problem list.   Objective:    Vitals:   05/05/24 0906  BP: 120/79  Pulse: 83  Weight: 257 lb (116.6 kg)    Fetal Status:  Fetal Heart Rate (bpm): 141   Movement: Present    General: Alert, oriented and cooperative. Patient is in no acute distress.  Skin: Skin is warm and dry. No rash noted.   Cardiovascular: Normal heart rate noted  Respiratory: Normal respiratory effort, no problems with respiration noted  Abdomen: Soft, gravid, appropriate for gestational age.  Pain/Pressure: Absent     Pelvic: Cervical exam deferred        Extremities: Normal range of motion.  Edema: None  Mental Status: Normal mood and affect. Normal behavior. Normal judgment and thought content.   Assessment and Plan:  Pregnancy: G3P1101 at [redacted]w[redacted]d 1. Supervision of high risk pregnancy, antepartum (Primary) Patient is doing well without complaints AFP today Early glucola due to abnormal A1C - Glucose Tolerance, 2 Hours w/1 Hour - AFP, Serum, Open Spina Bifida  2. [redacted] weeks gestation of pregnancy   3. History of cervical incompetence Patient with history of full term delivery, followed by 20  week preterm delivery Patient seen by MFM with plans for CL at 16 weeks scheduled on 9/22  4. Maternal obesity syndrome, antepartum Rx ASA provided  Preterm labor symptoms and general obstetric precautions including but not limited to vaginal bleeding, contractions, leaking of fluid and fetal movement were reviewed in detail with the patient. Please refer to After Visit Summary for other counseling recommendations.   Return in about 4 weeks (around 06/02/2024) for in person, ROB, High risk.  Future Appointments  Date Time Provider Department Center  05/05/2024 10:55 AM Paulino Cork, [place], MD [facility] None  05/12/2024  1:15 PM [facility] PROVIDER 1 [facility]  05/12/2024  1:30 PM [facility] US2 [facility]  05/19/2024  3:15 PM [facility] PROVIDER 1 [facility]  05/19/2024  3:30 PM [facility] US1 [facility]  05/30/2024  2:15 PM [facility] PROVIDER 1 [facility]  05/30/2024  2:30 PM [facility] US1 [facility]    Leyli Kevorkian, MD

## 2024-05-06 ENCOUNTER — Encounter: Payer: Self-pay | Admitting: *Deleted

## 2024-05-06 ENCOUNTER — Ambulatory Visit: Payer: Self-pay | Admitting: Obstetrics and Gynecology

## 2024-05-06 DIAGNOSIS — O24410 Gestational diabetes mellitus in pregnancy, diet controlled: Secondary | ICD-10-CM | POA: Insufficient documentation

## 2024-05-06 DIAGNOSIS — O24419 Gestational diabetes mellitus in pregnancy, unspecified control: Secondary | ICD-10-CM | POA: Insufficient documentation

## 2024-05-06 DIAGNOSIS — O24415 Gestational diabetes mellitus in pregnancy, controlled by oral hypoglycemic drugs: Secondary | ICD-10-CM | POA: Insufficient documentation

## 2024-05-06 LAB — GLUCOSE TOLERANCE, 2 HOURS W/ 1HR
Glucose, 1 hour: 170 mg/dL (ref 70–179)
Glucose, 2 hour: 141 mg/dL (ref 70–152)
Glucose, Fasting: 96 mg/dL — ABNORMAL HIGH (ref 70–91)

## 2024-05-07 MED ORDER — ACCU-CHEK SOFTCLIX LANCETS MISC: 12 refills | Status: AC

## 2024-05-07 MED ORDER — ACCU-CHEK GUIDE W/DEVICE KIT: 1.0000 | PACK | Freq: Four times a day (QID) | 0 refills | Status: AC

## 2024-05-07 MED ORDER — ACCU-CHEK GUIDE TEST VI STRP: ORAL_STRIP | 12 refills | Status: AC

## 2024-05-08 ENCOUNTER — Inpatient Hospital Stay (HOSPITAL_COMMUNITY)
Admission: AD | Admit: 2024-05-08 | Discharge: 2024-05-08 | Disposition: A | Discharge status: Home / Self Care | Attending: Obstetrics & Gynecology | Admitting: Obstetrics & Gynecology

## 2024-05-08 ENCOUNTER — Encounter (HOSPITAL_COMMUNITY): Payer: Self-pay | Admitting: Obstetrics & Gynecology

## 2024-05-08 DIAGNOSIS — O26892 Other specified pregnancy related conditions, second trimester: Secondary | ICD-10-CM | POA: Diagnosis not present

## 2024-05-08 DIAGNOSIS — Z3A15 15 weeks gestation of pregnancy: Secondary | ICD-10-CM

## 2024-05-08 DIAGNOSIS — G44209 Tension-type headache, unspecified, not intractable: Secondary | ICD-10-CM

## 2024-05-08 DIAGNOSIS — O09292 Supervision of pregnancy with other poor reproductive or obstetric history, second trimester: Secondary | ICD-10-CM | POA: Insufficient documentation

## 2024-05-08 DIAGNOSIS — R109 Unspecified abdominal pain: Secondary | ICD-10-CM | POA: Insufficient documentation

## 2024-05-08 DIAGNOSIS — R519 Headache, unspecified: Secondary | ICD-10-CM | POA: Diagnosis present

## 2024-05-08 DIAGNOSIS — O99210 Obesity complicating pregnancy, unspecified trimester: Secondary | ICD-10-CM

## 2024-05-08 DIAGNOSIS — O0990 Supervision of high risk pregnancy, unspecified, unspecified trimester: Secondary | ICD-10-CM

## 2024-05-08 DIAGNOSIS — R5383 Other fatigue: Secondary | ICD-10-CM | POA: Diagnosis present

## 2024-05-08 LAB — URINALYSIS, ROUTINE W REFLEX MICROSCOPIC
Bilirubin Urine: NEGATIVE
Glucose, UA: NEGATIVE mg/dL
Hgb urine dipstick: NEGATIVE
Ketones, ur: NEGATIVE mg/dL
Leukocytes,Ua: NEGATIVE
Nitrite: NEGATIVE
Protein, ur: NEGATIVE mg/dL
Specific Gravity, Urine: 1.013 (ref 1.005–1.030)
pH: 6 (ref 5.0–8.0)

## 2024-05-08 LAB — AFP, SERUM, OPEN SPINA BIFIDA
AFP Value: 19.2 ng/mL
Gest. Age on Collection Date: 14 wk
Maternal Age At EDD: 31.9 a
Weight: 257 [lb_av]

## 2024-05-08 LAB — WET PREP, GENITAL
Clue Cells Wet Prep HPF POC: NONE SEEN
Sperm: NONE SEEN
Trich, Wet Prep: NONE SEEN
WBC, Wet Prep HPF POC: <10 (ref <10)
Yeast Wet Prep HPF POC: NONE SEEN

## 2024-05-08 LAB — GLUCOSE, CAPILLARY: Glucose-Capillary: 77 mg/dL (ref 70–99)

## 2024-05-08 MED ORDER — CYCLOBENZAPRINE HCL 5 MG PO TABS
5.0000 mg | ORAL_TABLET | Freq: Once | ORAL | Status: AC
  Administered 2024-05-08: 5 mg via ORAL
  Filled 2024-05-08: qty 1

## 2024-05-08 MED ORDER — CYCLOBENZAPRINE HCL 5 MG PO TABS: 5.0000 mg | ORAL_TABLET | Freq: Three times a day (TID) | ORAL | 0 refills | Status: AC | PRN

## 2024-05-08 NOTE — MAU Note (Addendum)
 Melanie Ball is a 33 y.o. at [redacted]w[redacted]d here in MAU reporting h/a since Tues and today some mild cramping. Denies LOF or VB. Hx 20wk demise. Also fatigue. Took Tylenol  at 1500 but did not help h/a. Pain back of head, temples and eyeballs are sore Has some pelvic pressure at night when sleeping.   LMP: na Onset of complaint: Tues Pain score: 4 for cramping and 10 for h/a Vitals:   05/08/24 2033 05/08/24 2039  BP:  127/60  Pulse: 79   Resp: 18   Temp: 98.7 F (37.1 C)   SpO2: 100%      FHT: 164  Lab orders placed from triage: ua

## 2024-05-08 NOTE — MAU Provider Note (Signed)
 History     [id number]  Arrival date and time: 05/08/24 1902    Chief Complaint  Patient presents with   Headache   Fatigue     HPI Melanie Ball is a 33 y.o. at [redacted]w[redacted]d by [redacted]w[redacted]d US , who presents for Headache. She has had a headache since Tuesday. Also endorses mild abdominal cramping. Does admit to not drinking enough water. Took tylenol  today with no effect. Denies vaginal bleeding, leakage of fluid. Does have a history of incompetent cervix.     O/Positive/-- (08/18 1116)  Past Medical History:  Diagnosis Date   Anxiety    BMI 40.0-44.9, adult (HCC)    Chlamydia infection affecting pregnancy in second trimester 08/16/2020   Depression    Group B Streptococcus urinary tract infection affecting pregnancy in second trimester 08/16/2020   Less than 10,000 colonies, needs prophylaxis in labor    History of COVID-19 08/16/2020   +COVID at [facility] 09/11/19   Preterm delivery     Past Surgical History:  Procedure Laterality Date   NO PAST SURGERIES      Family History  Problem Relation Age of Onset   Diabetes Maternal Grandmother    Asthma Neg Hx    Cancer Neg Hx    Heart disease Neg Hx    Hypertension Neg Hx     Social History   Socioeconomic History   Marital status: Significant Other    Spouse name: Not on file   Number of children: Not on file   Years of education: Not on file   Highest education level: Not on file  Occupational History   Occupation: unemployed  Tobacco Use   Smoking status: Former    Current packs/day: 0.00    Types: Cigarettes    Quit date: 02/28/2019    Years since quitting: 5.1   Smokeless tobacco: Never  Vaping Use   Vaping status: Former  Substance and Sexual Activity   Alcohol use: Not Currently    Comment: about 1x per weel   Drug use: Not Currently   Sexual activity: Yes    Partners: Male    Birth control/protection: None  Other Topics Concern   Not on file  Social History Narrative   Not on file   Social Drivers of  Health   Financial Resource Strain: Not on file  Food Insecurity: Not on file  Transportation Needs: Not on file  Physical Activity: Not on file  Stress: Not on file  Social Connections: Not on file  Intimate Partner Violence: Not on file    No Known Allergies  No current facility-administered medications on file prior to encounter.   Current Outpatient Medications on File Prior to Encounter  Medication Sig Dispense Refill   Doxylamine -Pyridoxine  (DICLEGIS ) 10-10 MG TBEC Take 2 tabs at bedtime. If needed, add another tab in the morning. If needed, add another tab in the afternoon, up to 4 tabs/day. 100 tablet 5   Accu-Chek Softclix Lancets lancets Check blood sugars four times daily; when waking up, and 2 hours after breakfast, lunch, and dinner. 100 each 12   aspirin  EC 81 MG tablet Take 1 tablet (81 mg total) by mouth daily. Take after 12 weeks for prevention of preeclampsia later in pregnancy 300 tablet 2   Blood Glucose Monitoring Suppl (ACCU-CHEK GUIDE) w/Device KIT 1 Device by Does not apply route 4 (four) times daily. Check blood sugars four times daily; when waking up, and 2 hours after breakfast, lunch, and dinner. 1 kit 0  glucose blood (ACCU-CHEK GUIDE TEST) test strip Check blood sugars four times daily; when waking up, and 2 hours after breakfast, lunch, and dinner. 100 each 12   Prenatal 28-0.8 MG TABS Take 1 tablet by mouth daily. 30 tablet 12    Pertinent positives and negative per HPI, all others reviewed and negative  Physical Exam   BP 132/64   Pulse 91   Temp 98.7 F (37.1 C)   Resp 18   Ht 5' 6 (1.676 m)   Wt 116.6 kg   LMP 12/25/2023 (Approximate)   SpO2 99%   BMI 41.48 kg/m   Patient Vitals for the past 24 hrs:  BP Temp Pulse Resp SpO2 Height Weight  05/08/24 2234 132/64 -- 91 -- -- -- --  05/08/24 2130 (!) 129/56 -- 80 -- 99 % -- --  05/08/24 2039 127/60 -- -- -- -- -- --  05/08/24 2033 -- 98.7 F (37.1 C) 79 18 100 % 5' 6 (1.676 m) 116.6 kg     Physical Exam Vitals and nursing note reviewed. Exam conducted with a chaperone present.  Constitutional:      Appearance: She is well-developed.  HENT:     Head: Normocephalic and atraumatic.     Mouth/Throat:     Mouth: Mucous membranes are moist.  Eyes:     Extraocular Movements: Extraocular movements intact.  Cardiovascular:     Rate and Rhythm: Normal rate and regular rhythm.  Pulmonary:     Effort: Pulmonary effort is normal.  Abdominal:     Palpations: Abdomen is soft.     Tenderness: There is no abdominal tenderness.  Genitourinary:    Exam position: Lithotomy position.     Comments: Cervix appears closed without discharge. Cervical check also performed which was 0.5/thick/posterior.  Skin:    Capillary Refill: Capillary refill takes less than 2 seconds.  Neurological:     General: No focal deficit present.     Mental Status: She is alert.      Cervical Exam Dilation: Closed Effacement (%): Thick Exam by:: Lyndon Chapel,MD  Labs Results for orders placed or performed during the hospital encounter of 05/08/24 (from the past 24 hours)  Urinalysis, Routine w reflex microscopic -Urine, Clean Catch     Status: Abnormal   Collection Time: 05/08/24  8:49 PM  Result Value Ref Range   Color, Urine YELLOW YELLOW   APPearance CLEAR CLEAR   Specific Gravity, Urine 1.013 1.005 - 1.030   pH 6.0 5.0 - 8.0   Glucose, UA NEGATIVE NEGATIVE mg/dL   Hgb urine dipstick NEGATIVE NEGATIVE   Bilirubin Urine NEGATIVE NEGATIVE   Ketones, ur NEGATIVE NEGATIVE mg/dL   Protein, ur NEGATIVE NEGATIVE mg/dL   Nitrite NEGATIVE NEGATIVE   Leukocytes,Ua NEGATIVE NEGATIVE   RBC / HPF 0-5 0 - 5 RBC/hpf   WBC, UA 0-5 0 - 5 WBC/hpf   Bacteria, UA RARE (A) NONE SEEN   Squamous Epithelial / HPF 0-5 0 - 5 /HPF  Wet prep, genital     Status: None   Collection Time: 05/08/24  9:16 PM  Result Value Ref Range   Yeast Wet Prep HPF POC NONE SEEN NONE SEEN   Trich, Wet Prep NONE SEEN NONE SEEN    Clue Cells Wet Prep HPF POC NONE SEEN NONE SEEN   WBC, Wet Prep HPF POC <10 <10   Sperm NONE SEEN   Glucose, capillary     Status: None   Collection Time: 05/08/24  9:26 PM  Result  Value Ref Range   Glucose-Capillary 77 70 - 99 mg/dL    Imaging No results found.  MAU Course  Procedures Lab Orders         Wet prep, genital         Urinalysis, Routine w reflex microscopic -Urine, Clean Catch         Glucose, capillary    Meds ordered this encounter  Medications   cyclobenzaprine  (FLEXERIL ) tablet 5 mg   cyclobenzaprine  (FLEXERIL ) 5 MG tablet    Sig: Take 1 tablet (5 mg total) by mouth 3 (three) times daily as needed for muscle spasms.    Dispense:  30 tablet    Refill:  0   Imaging Orders  No imaging studies ordered today    MDM Moderate (Level 3-4)  Assessment and Plan  #Headache #[redacted] weeks gestation of pregnancy #Cramping  Nathali Vent is a 33 y.o. at [redacted]w[redacted]d by [redacted]w[redacted]d US , who presents for Headache.   -Hx of cervical incompetence. Has repeat US  scheduled for Monday. SSE and SVE as above. Encouraged to keep that appointment.  -U/A and wet prep not indicative of infectious process -Headache improved with tylenol  and flexeril .   -Stable to discharge home. -F/U Monday with US  -Rx sent for short course of flexeril  to use sparingly -All questions answered, anticipatory guidance and detailed return precautions provided.    Leland Raver L Ahmiya Abee, MD/MHA 05/09/24 1:45 AM  Allergies as of 05/08/2024   No Known Allergies      Medication List     STOP taking these medications    Blood Pressure Kit Devi   terconazole  0.8 % vaginal cream Commonly known as: TERAZOL 3        TAKE these medications    Accu-Chek Guide Test test strip Generic drug: glucose blood Check blood sugars four times daily; when waking up, and 2 hours after breakfast, lunch, and dinner.   Accu-Chek Guide w/Device Kit 1 Device by Does not apply route 4 (four) times daily. Check blood  sugars four times daily; when waking up, and 2 hours after breakfast, lunch, and dinner.   Accu-Chek Softclix Lancets lancets Check blood sugars four times daily; when waking up, and 2 hours after breakfast, lunch, and dinner.   aspirin  EC 81 MG tablet Take 1 tablet (81 mg total) by mouth daily. Take after 12 weeks for prevention of preeclampsia later in pregnancy   cyclobenzaprine  5 MG tablet Commonly known as: FLEXERIL  Take 1 tablet (5 mg total) by mouth 3 (three) times daily as needed for muscle spasms.   Doxylamine -Pyridoxine  10-10 MG Tbec Commonly known as: Diclegis  Take 2 tabs at bedtime. If needed, add another tab in the morning. If needed, add another tab in the afternoon, up to 4 tabs/day.   Prenatal 28-0.8 MG Tabs Take 1 tablet by mouth daily.

## 2024-05-12 ENCOUNTER — Ambulatory Visit (HOSPITAL_BASED_OUTPATIENT_CLINIC_OR_DEPARTMENT_OTHER)

## 2024-05-12 ENCOUNTER — Ambulatory Visit: Attending: Obstetrics and Gynecology | Admitting: Obstetrics

## 2024-05-12 ENCOUNTER — Other Ambulatory Visit: Payer: Self-pay | Admitting: Obstetrics

## 2024-05-12 ENCOUNTER — Other Ambulatory Visit: Payer: Self-pay | Admitting: *Deleted

## 2024-05-12 VITALS — BP 113/67 | HR 91

## 2024-05-12 DIAGNOSIS — O99212 Obesity complicating pregnancy, second trimester: Secondary | ICD-10-CM

## 2024-05-12 DIAGNOSIS — O99210 Obesity complicating pregnancy, unspecified trimester: Secondary | ICD-10-CM

## 2024-05-12 DIAGNOSIS — Z3A15 15 weeks gestation of pregnancy: Secondary | ICD-10-CM

## 2024-05-12 DIAGNOSIS — O0990 Supervision of high risk pregnancy, unspecified, unspecified trimester: Secondary | ICD-10-CM

## 2024-05-12 DIAGNOSIS — O09292 Supervision of pregnancy with other poor reproductive or obstetric history, second trimester: Secondary | ICD-10-CM | POA: Diagnosis not present

## 2024-05-12 DIAGNOSIS — O09291 Supervision of pregnancy with other poor reproductive or obstetric history, first trimester: Secondary | ICD-10-CM | POA: Insufficient documentation

## 2024-05-12 DIAGNOSIS — E669 Obesity, unspecified: Secondary | ICD-10-CM

## 2024-05-12 DIAGNOSIS — Z8759 Personal history of other complications of pregnancy, childbirth and the puerperium: Secondary | ICD-10-CM | POA: Diagnosis not present

## 2024-05-12 DIAGNOSIS — O09212 Supervision of pregnancy with history of pre-term labor, second trimester: Secondary | ICD-10-CM

## 2024-05-12 DIAGNOSIS — O3431 Maternal care for cervical incompetence, first trimester: Secondary | ICD-10-CM

## 2024-05-12 NOTE — Progress Notes (Signed)
 MFM Consult Note  Melanie Ball is currently at 15 weeks and 6 days.  She was seen due to a prior previable birth at 20 weeks and 1 day in 2022 due to advanced cervical dilatation.  She also has a history of a prior full-term delivery at 41+ weeks.  The patient denies any prior surgeries to her cervix.    She reports that in the pregnancy in 2022 that resulted in the previable birth, she began to experience lower abdominal cramping starting at 12 to 13 weeks.  She denies feeling any lower abdominal cramping in her current pregnancy.  She had a cell free DNA test drawn earlier in her pregnancy which indicated a low risk for trisomy 32, 2, and 13.  A female fetus is predicted.  On today's exam, there was normal amniotic fluid noted.  The fetus was in the vertex presentation.  A possible two-vessel umbilical cord was noted on today's exam.  The patient was reassured that the two-vessel umbilical cord is most likely a normal variant.  The small association of a two-vessel cord with trisomy 18 was discussed.  A transvaginal ultrasound performed today showed a cervical length of 3.1 cm long without any signs of funneling.  She was reassured that based on today's normal cervical length, her risk of a preterm birth is low at this time.  She will return in 2 weeks for another transvaginal cervical length measurement.  Should progressive cervical shortening be noted later in her pregnancy, either daily vaginal progesterone or cervical cerclage may be recommended.  The patient stated that all of her questions were answered today.  A total of 20 minutes was spent counseling and coordinating the care for this patient.  Greater than 50% of the time was spent in direct face-to-face contact.

## 2024-05-19 ENCOUNTER — Ambulatory Visit

## 2024-05-19 ENCOUNTER — Other Ambulatory Visit

## 2024-05-24 ENCOUNTER — Encounter: Payer: Self-pay | Admitting: Advanced Practice Midwife

## 2024-05-30 ENCOUNTER — Ambulatory Visit: Attending: Obstetrics and Gynecology

## 2024-05-30 ENCOUNTER — Ambulatory Visit

## 2024-05-30 ENCOUNTER — Ambulatory Visit: Admitting: Maternal & Fetal Medicine

## 2024-05-30 VITALS — BP 115/42

## 2024-05-30 DIAGNOSIS — O0990 Supervision of high risk pregnancy, unspecified, unspecified trimester: Secondary | ICD-10-CM

## 2024-05-30 DIAGNOSIS — Z3A18 18 weeks gestation of pregnancy: Secondary | ICD-10-CM | POA: Insufficient documentation

## 2024-05-30 DIAGNOSIS — O3432 Maternal care for cervical incompetence, second trimester: Secondary | ICD-10-CM | POA: Diagnosis not present

## 2024-05-30 DIAGNOSIS — O99210 Obesity complicating pregnancy, unspecified trimester: Secondary | ICD-10-CM

## 2024-05-30 DIAGNOSIS — O43192 Other malformation of placenta, second trimester: Secondary | ICD-10-CM

## 2024-05-30 DIAGNOSIS — O09292 Supervision of pregnancy with other poor reproductive or obstetric history, second trimester: Secondary | ICD-10-CM

## 2024-05-30 DIAGNOSIS — O24410 Gestational diabetes mellitus in pregnancy, diet controlled: Secondary | ICD-10-CM | POA: Diagnosis present

## 2024-05-30 DIAGNOSIS — E669 Obesity, unspecified: Secondary | ICD-10-CM

## 2024-05-30 DIAGNOSIS — O09291 Supervision of pregnancy with other poor reproductive or obstetric history, first trimester: Secondary | ICD-10-CM | POA: Diagnosis present

## 2024-05-30 DIAGNOSIS — O99212 Obesity complicating pregnancy, second trimester: Secondary | ICD-10-CM

## 2024-05-30 NOTE — Progress Notes (Signed)
   Patient information  Patient Name: Melanie Ball  Patient MRN:   [id number]  Referring practice: MFM Referring Provider: [facility]  Problem List   Patient Active Problem List   Diagnosis Date Noted   Gestational diabetes( failed GTT 2nd trimester) 05/06/2024   Maternal obesity syndrome, antepartum 05/05/2024   History of preterm delivery, currently pregnant-fetal demise 04/11/2024   Supervision of high risk pregnancy, antepartum 03/10/2024   History of cervical incompetence 12/07/2020   Premature cervical dilation in second trimester 08/27/2020   Obesity 02/09/2020    Maternal Fetal medicine Consult  Melanie Ball is a 33 y.o. G3P1101 at [redacted]w[redacted]d here for ultrasound and consultation. Melanie Ball is doing well today with no acute concerns. Today we focused on the following:   GDM: Currently diet controlled.  Anatomy scan scheduled in 2 weeks.  Growth ultrasounds in the third trimester.  Antenatal testing reserved for medication  History of previable birth: Occurred around 20 weeks, cervical length is normal today.  Patient denies any symptoms concerning for preterm birth.  Cervical length will be assessed at the next ultrasound.  Marginal cord insertion: Serial growth ultrasounds in third trimester  The patient had time to ask questions that were answered to her satisfaction.  She verbalized understanding and agrees to proceed with the plan below.  Sonographic findings Single intrauterine pregnancy at 18w 3d  Fetal cardiac activity:  Observed and appears normal. Presentation: Transverse, head to maternal left. The anatomic structures that were well seen appear normal without evidence of soft markers. Due to poor acoustic windows some structures remain suboptimally visualized. Fetal biometry shows the estimated fetal weight at the  percentile.  Amniotic fluid: Within normal limits.  MVP: 4.21 cm. Placenta: Posterior Fundal. Adnexa: No adnexal mass  visualized. Cervical length: 3.4 cm.  There are limitations of prenatal ultrasound such as the inability to detect certain abnormalities due to poor visualization. Various factors such as fetal position, gestational age and maternal body habitus may increase the difficulty in visualizing the fetal anatomy.    Recommendations - Follow-up as scheduled for anatomy and cervical length  Review of Systems: A review of systems was performed and was negative except per HPI   Vitals and Physical Exam    05/30/2024    2:31 PM 05/12/2024    1:06 PM 05/08/2024   10:34 PM  Vitals with BMI  Systolic 115 113 867  Diastolic 42 67 64  Pulse  91 91    Sitting comfortably on the sonogram table Nonlabored breathing Normal rate and rhythm Abdomen is nontender  Past pregnancies OB History  Gravida Para Term Preterm AB Living  3 2 1 1  0 1  SAB IAB Ectopic Multiple Live Births     0 1    # Outcome Date GA Lbr Len/2nd Weight Sex Type Anes PTL Lv  3 Current           2 Preterm 08/28/20 [redacted]w[redacted]d    Vag-Spont   FD  1 Term 02/09/20 [redacted]w[redacted]d 15:04 / 02:32 8 lb 8.5 oz (3.87 kg) M Vag-Spont EPI  LIV     I spent 20 minutes reviewing the patients chart, including labs and images as well as counseling the patient about her medical conditions. Greater than 50% of the time was spent in direct face-to-face patient counseling.  Melanie Ball  MFM, [place]   05/30/2024  3:27 PM

## 2024-06-02 ENCOUNTER — Encounter: Admitting: Physician Assistant

## 2024-06-09 ENCOUNTER — Other Ambulatory Visit: Payer: Self-pay | Admitting: *Deleted

## 2024-06-09 DIAGNOSIS — O09899 Supervision of other high risk pregnancies, unspecified trimester: Secondary | ICD-10-CM

## 2024-06-09 DIAGNOSIS — Z8742 Personal history of other diseases of the female genital tract: Secondary | ICD-10-CM

## 2024-06-10 ENCOUNTER — Other Ambulatory Visit: Payer: Self-pay | Admitting: *Deleted

## 2024-06-10 ENCOUNTER — Ambulatory Visit: Attending: Obstetrics | Admitting: Obstetrics

## 2024-06-10 ENCOUNTER — Ambulatory Visit

## 2024-06-10 VITALS — BP 105/49

## 2024-06-10 DIAGNOSIS — O09899 Supervision of other high risk pregnancies, unspecified trimester: Secondary | ICD-10-CM

## 2024-06-10 DIAGNOSIS — O0992 Supervision of high risk pregnancy, unspecified, second trimester: Secondary | ICD-10-CM | POA: Insufficient documentation

## 2024-06-10 DIAGNOSIS — O99212 Obesity complicating pregnancy, second trimester: Secondary | ICD-10-CM

## 2024-06-10 DIAGNOSIS — O43192 Other malformation of placenta, second trimester: Secondary | ICD-10-CM | POA: Diagnosis not present

## 2024-06-10 DIAGNOSIS — O3432 Maternal care for cervical incompetence, second trimester: Secondary | ICD-10-CM | POA: Diagnosis not present

## 2024-06-10 DIAGNOSIS — Z3A20 20 weeks gestation of pregnancy: Secondary | ICD-10-CM | POA: Insufficient documentation

## 2024-06-10 DIAGNOSIS — E669 Obesity, unspecified: Secondary | ICD-10-CM

## 2024-06-10 DIAGNOSIS — O24410 Gestational diabetes mellitus in pregnancy, diet controlled: Secondary | ICD-10-CM | POA: Diagnosis not present

## 2024-06-10 DIAGNOSIS — O09292 Supervision of pregnancy with other poor reproductive or obstetric history, second trimester: Secondary | ICD-10-CM | POA: Insufficient documentation

## 2024-06-10 DIAGNOSIS — O0990 Supervision of high risk pregnancy, unspecified, unspecified trimester: Secondary | ICD-10-CM

## 2024-06-10 DIAGNOSIS — Z8742 Personal history of other diseases of the female genital tract: Secondary | ICD-10-CM

## 2024-06-10 DIAGNOSIS — O99210 Obesity complicating pregnancy, unspecified trimester: Secondary | ICD-10-CM

## 2024-06-10 DIAGNOSIS — O3662X0 Maternal care for excessive fetal growth, second trimester, not applicable or unspecified: Secondary | ICD-10-CM | POA: Diagnosis not present

## 2024-06-11 ENCOUNTER — Ambulatory Visit

## 2024-06-11 NOTE — Progress Notes (Signed)
 MFM Consult Note  Ilean Spradlin is currently at 20 weeks and 0 days.  She was seen due to maternal obesity with a BMI of 38, diet-controlled gestational diabetes, and history of a prior 20-week delivery.  She denies any problems in her current pregnancy and reports that her fingerstick values are mostly within normal limits.  She had a cell free DNA test earlier in her pregnancy which indicated a low risk for trisomy 4, 72, and 13. A female fetus is predicted.   Sonographic findings Single intrauterine pregnancy at 20w 0d  Fetal cardiac activity:  Observed and appears normal. Presentation: Cephalic. A fetus with a two-vessel umbilical cord was noted on today's exam.  Although the views of the fetal anatomy were limited due to maternal body habitus, what was visualized today appeared within normal limits. Fetal biometry shows the estimated fetal weight at the 97th percentile.  Amniotic fluid: Within normal limits.  MVP: 6.85 cm. Placenta: Posterior Fundal. Adnexa: No abnormality visualized. Cervical length: 3.4 cm.  A transvaginal ultrasound performed today showed a cervical length of 3.4 cm long without any signs of funneling.  The patient was reassured that based on today's normal cervical length, her risk of a preterm birth is low at this time.  The patient was informed that anomalies may be missed due to technical limitations. If the fetus is in a suboptimal position or maternal habitus is increased, visualization of the fetus in the maternal uterus may be impaired.  Fetus with a two-vessel umbilical cord A two-vessel umbilical cord was noted on today's exam.   She was advised regarding the small association of trisomy 18 with a two-vessel cord. The patient was reassured that based on her negative cell free DNA test and as there were no other anomalies noted on today's exam, that it is highly unlikely that her baby will have trisomy 18.     Due to the small possibility of trisomy  18, the patient was offered and declined an amniocentesis today for definitive diagnosis of fetal aneuploidy.  The small risk of fetal growth issues later in her pregnancy due to the two-vessel cord was also discussed today.   The patient was reassured today that the two-vessel cord is most likely a normal variant and that her baby will most likely not be affected by this finding after delivery.    Gestational diabetes She was encouraged to continue to monitor her fingersticks 4 times daily (fasting and 2 hours after each meal).   She was advised that our goals for her fingerstick values are fasting values of 90-95 or less and two-hour postprandial values of 120 or less.   Should the majority (greater than 50%) of her fingerstick results be above these values, she may have to be started on insulin or metformin to help her achieve better glycemic control.  The patient was advised that getting her fingerstick values as close to these goals as possible would provide her with the most optimal obstetrical outcome. The increased risk of polyhydramnios, fetal macrosomia, and preeclampsia associated with diabetes was also discussed.   We will continue to follow her with monthly growth ultrasounds.   Weekly fetal testing should be started at 32 weeks should she require insulin or metformin for treatment. Delivery for well-controlled diabetes in pregnancy is usually recommended at around 39 weeks.   Delivery at 37 weeks may be considered should her glycemic control be poor.  A follow-up exam to complete the views of the fetal anatomy and another  transvaginal cervical length measurement was scheduled in 3 weeks.    The patient stated that all of her questions were answered today.  A total of 30 minutes was spent counseling and coordinating the care for this patient.  Greater than 50% of the time was spent in direct face-to-face contact.

## 2024-06-12 ENCOUNTER — Encounter: Payer: Self-pay | Admitting: Obstetrics

## 2024-06-12 ENCOUNTER — Ambulatory Visit: Admitting: Obstetrics

## 2024-06-12 VITALS — BP 109/63 | HR 81 | Wt 264.2 lb

## 2024-06-12 DIAGNOSIS — O3432 Maternal care for cervical incompetence, second trimester: Secondary | ICD-10-CM | POA: Diagnosis not present

## 2024-06-12 DIAGNOSIS — O24415 Gestational diabetes mellitus in pregnancy, controlled by oral hypoglycemic drugs: Secondary | ICD-10-CM

## 2024-06-12 DIAGNOSIS — O09899 Supervision of other high risk pregnancies, unspecified trimester: Secondary | ICD-10-CM

## 2024-06-12 DIAGNOSIS — Z8742 Personal history of other diseases of the female genital tract: Secondary | ICD-10-CM | POA: Diagnosis not present

## 2024-06-12 DIAGNOSIS — O99210 Obesity complicating pregnancy, unspecified trimester: Secondary | ICD-10-CM

## 2024-06-12 DIAGNOSIS — O0990 Supervision of high risk pregnancy, unspecified, unspecified trimester: Secondary | ICD-10-CM | POA: Diagnosis not present

## 2024-06-12 MED ORDER — METFORMIN HCL ER 500 MG PO TB24: 1000.0000 mg | ORAL_TABLET | Freq: Two times a day (BID) | ORAL | 5 refills | Status: AC

## 2024-06-12 NOTE — Progress Notes (Signed)
 Subjective:  Melanie Ball is a 33 y.o. G3P1101 at [redacted]w[redacted]d being seen today for ongoing prenatal care.  She is currently monitored for the following issues for this high-risk pregnancy and has Obesity; Premature cervical dilation in second trimester; History of cervical incompetence; Supervision of high risk pregnancy, antepartum; History of preterm delivery, currently pregnant-fetal demise; Maternal obesity syndrome, antepartum; and Gestational diabetes, diet controlled on their problem list.  Patient reports heartburn.  Contractions: Not present. Vag. Bleeding: None.  Movement: Present. Denies leaking of fluid.   The following portions of the patient's history were reviewed and updated as appropriate: allergies, current medications, past family history, past medical history, past social history, past surgical history and problem list. Problem list updated.  Objective:   Vitals:   06/12/24 1139  BP: 109/63  Pulse: 81  Weight: 264 lb 3.2 oz (119.8 kg)    Fetal Status: Fetal Heart Rate (bpm): 149   Movement: Present     General:  Alert, oriented and cooperative. Patient is in no acute distress.  Skin: Skin is warm and dry. No rash noted.   Cardiovascular: Normal heart rate noted  Respiratory: Normal respiratory effort, no problems with respiration noted  Abdomen: Soft, gravid, appropriate for gestational age. Pain/Pressure: Absent     Pelvic:  Cervical exam deferred        Extremities: Normal range of motion.  Edema: None  Mental Status: Normal mood and affect. Normal behavior. Normal judgment and thought content.   Urinalysis:       US  MFM OB DETAIL +14 WK (Accession [id number]) (Order [id number]) Imaging Date: 06/10/2024 Department: [facility] Released By: Jillene Marcos CROME Authorizing: Alger Gong, MD   Exam Status  Status  Final [99]   PACS Intelerad Image Link   Show images for US  MFM OB DETAIL +14 WK Related Results          US   MFM OB Transvaginal Final result 06/10/2024       ----------------------------------------------------------------------  OBSTETRICS REPORT                       (Signed Final 06/11/2024 07:30 am) ---------------------------------------------------------------------- Patient Info   ID #:       [id number]                          D.O.B.:  1991/07/18 (33 yrs)(F)  Name:       Melanie Ball              Visit Date: 06/10/2024 11:35 am ---------------------------------------------------------------------- Performed By ...   Study Result  Narrative & Impression    ----------------------------------------------------------------------  OBSTETRICS REPORT                       (Signed Final 06/11/2024 07:30 am) ---------------------------------------------------------------------- Patient Info    ID #:       [id number]                          D.O.B.:  Aug 21, 1991 (33 yrs)(F)  Name:       Melanie Ball              Visit Date: 06/10/2024 11:35 am ---------------------------------------------------------------------- Performed By    Attending:        Steffan Keys MD         Ref. Address:     [address]                                                             [place], [place]                                                             [address]  Performed By:     Jonette Nap        [facility]                                  [facility]  Referred By:      [facility] ---------------------------------------------------------------------- Orders    #  Description                           Code        Ordered By  1  US  MFM OB DETAIL +14 WK               76811.01    PEGGY CONSTANT  2  US  MFM OB TRANSVAGINAL                 23182.7     YU FANG ----------------------------------------------------------------------    #  Order #                     Accession #                Episode #  1  [id number]                   [id number]                 [id number]  2  [id number]                   [id number]                 [id number] ---------------------------------------------------------------------- Indications    Gestational diabetes in pregnancy, diet        O24.410  controlled  Marginal insertion of umbilical cord  affecting O43.192  management of mother in second trimester  2 vessel umbilical cord                        O69.89X0  Obesity complicating pregnancy, second         O99.212  trimester (BMI 38)  Large for gestational age fetus affecting      O36.60X0  management of mother  Cervical incompetence, second trimester        O34.32  Poor obstetric history: Previous midtrimester  O09.299  loss (add cerv insuff if applicable)  Poor obstetric history: Previous IUFD          O74.299  (stillbirth at 20w)  [redacted] weeks gestation of pregnancy                Z3A.20 ---------------------------------------------------------------------- Fetal Evaluation    Num Of Fetuses:         1  Fetal Heart Rate(bpm):  144  Cardiac Activity:       Observed  Presentation:           Cephalic  Placenta:               Posterior Fundal  P. Cord Insertion:      Marginal insertion  Amniotic Fluid  AFI FV:      Within normal limits                                Largest Pocket(cm)                              6.85 ---------------------------------------------------------------------- Biometry    BPD:      48.1  mm     G. Age:  20w 4d         72  %    CI:        75.53   %    70 - 86                                                          FL/HC:      19.0   %    16.8 - 19.8  HC:      175.5  mm     G. Age:  20w 0d         45  %    HC/AC:      1.01        1.09 - 1.39  AC:      173.6  mm     G. Age:  22w 2d         97  %    FL/BPD:      69.4   %  FL:       33.4  mm     G. Age:  20w 3d         59  %    FL/AC:      19.2   %    20 - 24  HUM:      31.8  mm     G. Age:  20w 4d         70  %  CER:  20.4  mm     G. Age:  19w 4d         59  %  NFT:       3.7  mm  LV:        5.9  mm  CM:        5.7  mm    Est. FW:     413  gm    0 lb 15 oz      97  % ---------------------------------------------------------------------- OB History    Gravidity:    3         Term:   1        Prem:   1        SAB:   0  TOP:          0       Ectopic:  0        Living: 1 ---------------------------------------------------------------------- Gestational Age    LMP:           24w 0d        Date:  12/25/23                   EDD:   09/30/24  U/S Today:     20w 6d                                        EDD:   10/22/24  Best:          governor 0d     Det. By:  Early Ultrasound         EDD:   10/28/24                                      (03/10/24) ---------------------------------------------------------------------- Targeted Anatomy    Central Nervous System  Calvarium/Cranial V.:  Appears normal         Cereb./Vermis:          Appears normal  Cavum:                 Appears normal         Cisterna Magna:         Appears normal  Lateral Ventricles:    Appears normal         Midline Falx:           Appears normal  Choroid Plexus:        Appears normal    Spine  Cervical:              Appears normal         Sacral:                 Appears normal  Thoracic:              Appears normal         Shape/Curvature:        Appears normal  Lumbar:                Appears normal    Head/Neck  Lips:                  Appears normal         Profile:  Not well visualized  Neck:                  Appears normal         Orbits/Eyes:            Appears normal  Nuchal Fold:           Appears normal         Mandible:               Appears normal  Nasal Bone:            Not well visualized    Maxilla:                Appears normal    Thorax  4 Chamber  View:        Not well visualized    Interventr. Septum:     Appears normal  Cardiac Rhythm:        Normal                 Cardiac Axis:           Normal  Cardiac Situs:         Appears normal         Diaphragm:              Appears normal  Rt Outflow Tract:      Appears normal         3 Vessel View:          Appears normal  Lt Outflow Tract:      Appears normal         3 V Trachea View:       Appears normal  Aortic Arch:           Appears normal         IVC:                    Appears normal  Ductal Arch:           Appears normal         Crossing:               Appears normal  SVC:                   Appears normal    Abdomen  Ventral Wall:          Appears normal         Lt Kidney:              Appears normal  Cord Insertion:        Appears normal         Rt Kidney:              Appears normal  Situs:                 Appears normal         Bladder:                Appears normal  Stomach:               Appears normal    Extremities  Lt Humerus:            Appears normal         Lt Femur:               Appears normal  Rt Humerus:  Appears normal         Rt Femur:               Appears normal  Lt Forearm:            Appears normal         Lt Lower Leg:           Appears normal  Rt Forearm:            Appears normal         Rt Lower Leg:           Appears normal  Lt Hand:               Limited                Lt Foot:                Heel visualized  Rt Hand:               Limited                Rt Foot:                Foot visualized    Other  Umbilical Cord:        Single UA              Genitalia:              Female-nml    Comment:     Technically difficult due to maternal habitus and fetal position. ---------------------------------------------------------------------- Cervix Uterus Adnexa    Cervix  Length:            3.4  cm.  Measured transvaginally    Uterus  No abnormality visualized.    Right Ovary  Not visualized.    Left Ovary  Size(cm)     2.51   x   2.83    x  1.97      Vol(ml): 7.33  Within normal limits.    Cul De Sac  No free fluid seen.    Adnexa  No abnormality visualized ---------------------------------------------------------------------- Comments  Melanie Ball is currently at 20 weeks and 0 days.  She  was seen due to maternal obesity with a BMI of 38, diet-  controlled gestational diabetes, and history of a prior 20-week  delivery.  She denies any problems in her current pregnancy and  reports that her fingerstick values are mostly within normal  limits.  She had a cell free DNA test earlier in her pregnancy which  indicated a low risk for trisomy 74, 52, and 13. A female fetus  is predicted.  Sonographic findings  Single intrauterine pregnancy at 20w 0d  Fetal cardiac activity:  Observed and appears normal.  Presentation: Cephalic.  A fetus with a two-vessel umbilical cord was noted on today's  exam.  Although the views of the fetal anatomy were limited  due to maternal body habitus, what was visualized today  appeared within normal limits.  Fetal biometry shows the estimated fetal weight at the 97th  percentile.  Amniotic fluid: Within normal limits.  MVP: 6.85 cm.  Placenta: Posterior Fundal.  Adnexa: No abnormality visualized.  Cervical length: 3.4 cm.    A transvaginal ultrasound performed today showed a cervical  length of 3.4 cm long without any signs of funneling.  The  patient was reassured that based on today's normal cervical  length, her risk of a preterm birth  is low at this time.  The patient was informed that anomalies may be missed due  to technical limitations. If the fetus is in a suboptimal position  or maternal habitus is increased, visualization of the fetus in  the maternal uterus may be impaired.  Fetus with a two-vessel umbilical cord  A two-vessel umbilical cord was noted on today's exam.  She was advised regarding the small association of trisomy  18 with a two-vessel cord. The  patient was reassured that  based on her negative cell free DNA test and as there were  no other anomalies noted on today's exam, that it is highly  unlikely that her baby will have trisomy 18.  Due to the small possibility of trisomy 18, the patient was  offered and declined an amniocentesis today for definitive  diagnosis of fetal aneuploidy. The small risk of fetal growth  issues later in her pregnancy due to the two-vessel cord was  also discussed today.  The patient was reassured today that the two-vessel cord is  most likely a normal variant and that her baby will most likely  not be affected by this finding after delivery.  Gestational diabetes  She was encouraged to continue to monitor her fingersticks 4  times daily (fasting and 2 hours after each meal).  She was advised that our goals for her fingerstick values are  fasting values of 90-95 or less and two-hour postprandial  values of 120 or less.  Should the majority (greater than 50%) of her fingerstick  results be above these values, she may have to be started on  insulin or metformin to help her achieve better glycemic  control.  The patient was advised that getting her fingerstick values as  close to these goals as possible would provide her with the  most optimal obstetrical outcome.  The increased risk of polyhydramnios, fetal macrosomia, and  preeclampsia associated with diabetes was also discussed.  We will continue to follow her with monthly growth  ultrasounds.  Weekly fetal testing should be started at 32 weeks should  she require insulin or metformin for treatment.  Delivery for well-controlled diabetes in pregnancy is usually  recommended at around 39 weeks.  Delivery at 37 weeks may be considered should her glycemic  control be poor.  A follow-up exam to complete the views of the fetal anatomy  and another transvaginal cervical length measurement was  scheduled in 3 weeks.  The patient stated that all of  her questions were answered  today.  A total of 30 minutes was spent counseling and coordinating  the care for this patient.  Greater than 50% of the time was  spent in direct face-to-face contact. ----------------------------------------------------------------------                   Steffan Keys, MD Electronically Signed Final Report   06/11/2024 07:30 am ----------------------------------------------------------------------         US  MFM OB Transvaginal (Accession [id number]) (Order [id number]) Imaging Date: 06/10/2024 Department: [facility] Released By: Jillene Marcos CROME Authorizing: Keys Babara Rushie Steffan, MD   Exam Status  Status  Final [99]   PACS Intelerad Image Link   Show images for US  MFM OB Transvaginal Related Results          US  MFM OB DETAIL +14 WK Final result 06/10/2024       ----------------------------------------------------------------------  OBSTETRICS REPORT                       (  Signed Final 06/11/2024 07:30 am) ---------------------------------------------------------------------- Patient Info   ID #:       [id number]                          D.O.B.:  07-Feb-1991 (33 yrs)(F)  Name:       Melanie Ball              Visit Date: 06/10/2024 11:35 am ---------------------------------------------------------------------- Performed By ...   Study Result  Narrative & Impression    ----------------------------------------------------------------------  OBSTETRICS REPORT                       (Signed Final 06/11/2024 07:30 am) ---------------------------------------------------------------------- Patient Info    ID #:       [id number]                          D.O.B.:  1991/07/11 (33 yrs)(F)  Name:       Melanie Ball              Visit Date: 06/10/2024 11:35 am ---------------------------------------------------------------------- Performed By    Attending:        Steffan Keys MD         Ref. Address:     [address]                                                             [place], [place]                                                             [address]  Performed By:     Jonette Nap        [facility]                                  [facility]                                                             [facility]  Referred By:      [facility] ---------------------------------------------------------------------- Orders    #  Description                           Code        Ordered By  1  US  MFM OB DETAIL +14 WK               76811.01    PEGGY CONSTANT  2  US  MFM OB TRANSVAGINAL                [id number]     YU FANG ----------------------------------------------------------------------    #  Order #                     Accession #                Episode #  1  [id number]                   [id number]                 [id number]  2  [id number]                   [id number]                 [id number] ---------------------------------------------------------------------- Indications    Gestational diabetes in pregnancy, diet        O24.410  controlled  Marginal insertion of umbilical cord affecting O43.192  management of mother in second trimester  2 vessel umbilical cord                        O69.89X0  Obesity complicating pregnancy, second         O99.212  trimester (BMI 38)  Large for gestational age fetus affecting      O36.60X0  management of mother  Cervical incompetence, second trimester        O34.32  Poor obstetric history: Previous midtrimester  O09.299  loss (add cerv insuff if applicable)  Poor obstetric history: Previous IUFD          O79.299  (stillbirth at 59w)  [redacted] weeks gestation of pregnancy                 Z3A.20 ---------------------------------------------------------------------- Fetal Evaluation    Num Of Fetuses:         1  Fetal Heart Rate(bpm):  144  Cardiac Activity:       Observed  Presentation:           Cephalic  Placenta:               Posterior Fundal  P. Cord Insertion:      Marginal insertion  Amniotic Fluid  AFI FV:      Within normal limits                                Largest Pocket(cm)                              6.85 ---------------------------------------------------------------------- Biometry    BPD:      48.1  mm     G. Age:  20w 4d         72  %  CI:        75.53   %    70 - 86                                                          FL/HC:      19.0   %    16.8 - 19.8  HC:      175.5  mm     G. Age:  20w 0d         45  %    HC/AC:      1.01        1.09 - 1.39  AC:      173.6  mm     G. Age:  22w 2d         97  %    FL/BPD:     69.4   %  FL:       33.4  mm     G. Age:  20w 3d         59  %    FL/AC:      19.2   %    20 - 24  HUM:      31.8  mm     G. Age:  20w 4d         70  %  CER:      20.4  mm     G. Age:  19w 4d         59  %  NFT:       3.7  mm  LV:        5.9  mm  CM:        5.7  mm    Est. FW:     413  gm    0 lb 15 oz      97  % ---------------------------------------------------------------------- OB History    Gravidity:    3         Term:   1        Prem:   1        SAB:   0  TOP:          0       Ectopic:  0        Living: 1 ---------------------------------------------------------------------- Gestational Age    LMP:           24w 0d        Date:  12/25/23                   EDD:   09/30/24  U/S Today:     20w 6d                                        EDD:   10/22/24  Best:          governor 0d     Det. By:  Early Ultrasound         EDD:   10/28/24                                      (  03/10/24) ---------------------------------------------------------------------- Targeted Anatomy    Central Nervous System  Calvarium/Cranial V.:  Appears  normal         Cereb./Vermis:          Appears normal  Cavum:                 Appears normal         Cisterna Magna:         Appears normal  Lateral Ventricles:    Appears normal         Midline Falx:           Appears normal  Choroid Plexus:        Appears normal    Spine  Cervical:              Appears normal         Sacral:                 Appears normal  Thoracic:              Appears normal         Shape/Curvature:        Appears normal  Lumbar:                Appears normal    Head/Neck  Lips:                  Appears normal         Profile:                Not well visualized  Neck:                  Appears normal         Orbits/Eyes:            Appears normal  Nuchal Fold:           Appears normal         Mandible:               Appears normal  Nasal Bone:            Not well visualized    Maxilla:                Appears normal    Thorax  4 Chamber View:        Not well visualized    Interventr. Septum:     Appears normal  Cardiac Rhythm:        Normal                 Cardiac Axis:           Normal  Cardiac Situs:         Appears normal         Diaphragm:              Appears normal  Rt Outflow Tract:      Appears normal         3 Vessel View:          Appears normal  Lt Outflow Tract:      Appears normal         3 V Trachea View:       Appears normal  Aortic Arch:           Appears normal         IVC:  Appears normal  Ductal Arch:           Appears normal         Crossing:               Appears normal  SVC:                   Appears normal    Abdomen  Ventral Wall:          Appears normal         Lt Kidney:              Appears normal  Cord Insertion:        Appears normal         Rt Kidney:              Appears normal  Situs:                 Appears normal         Bladder:                Appears normal  Stomach:               Appears normal    Extremities  Lt Humerus:            Appears normal         Lt Femur:               Appears normal  Rt Humerus:             Appears normal         Rt Femur:               Appears normal  Lt Forearm:            Appears normal         Lt Lower Leg:           Appears normal  Rt Forearm:            Appears normal         Rt Lower Leg:           Appears normal  Lt Hand:               Limited                Lt Foot:                Heel visualized  Rt Hand:               Limited                Rt Foot:                Foot visualized    Other  Umbilical Cord:        Single UA              Genitalia:              Female-nml    Comment:     Technically difficult due to maternal habitus and fetal position. ---------------------------------------------------------------------- Cervix Uterus Adnexa    Cervix  Length:            3.4  cm.  Measured transvaginally    Uterus  No abnormality visualized.    Right Ovary  Not visualized.    Left Ovary  Size(cm)     2.51   x  2.83   x  1.97      Vol(ml): 7.33  Within normal limits.    Cul De Sac  No free fluid seen.    Adnexa  No abnormality visualized ---------------------------------------------------------------------- Comments  Melanie Ball is currently at 20 weeks and 0 days.  She  was seen due to maternal obesity with a BMI of 38, diet-  controlled gestational diabetes, and history of a prior 20-week  delivery.  She denies any problems in her current pregnancy and  reports that her fingerstick values are mostly within normal  limits.  She had a cell free DNA test earlier in her pregnancy which  indicated a low risk for trisomy 56, 53, and 13. A female fetus  is predicted.  Sonographic findings  Single intrauterine pregnancy at 20w 0d  Fetal cardiac activity:  Observed and appears normal.  Presentation: Cephalic.  A fetus with a two-vessel umbilical cord was noted on today's  exam.  Although the views of the fetal anatomy were limited  due to maternal body habitus, what was visualized today  appeared within normal limits.  Fetal biometry  shows the estimated fetal weight at the 97th  percentile.  Amniotic fluid: Within normal limits.  MVP: 6.85 cm.  Placenta: Posterior Fundal.  Adnexa: No abnormality visualized.  Cervical length: 3.4 cm.    A transvaginal ultrasound performed today showed a cervical  length of 3.4 cm long without any signs of funneling.  The  patient was reassured that based on today's normal cervical  length, her risk of a preterm birth is low at this time.  The patient was informed that anomalies may be missed due  to technical limitations. If the fetus is in a suboptimal position  or maternal habitus is increased, visualization of the fetus in  the maternal uterus may be impaired.  Fetus with a two-vessel umbilical cord  A two-vessel umbilical cord was noted on today's exam.  She was advised regarding the small association of trisomy  18 with a two-vessel cord. The patient was reassured that  based on her negative cell free DNA test and as there were  no other anomalies noted on today's exam, that it is highly  unlikely that her baby will have trisomy 18.  Due to the small possibility of trisomy 18, the patient was  offered and declined an amniocentesis today for definitive  diagnosis of fetal aneuploidy. The small risk of fetal growth  issues later in her pregnancy due to the two-vessel cord was  also discussed today.  The patient was reassured today that the two-vessel cord is  most likely a normal variant and that her baby will most likely  not be affected by this finding after delivery.  Gestational diabetes  She was encouraged to continue to monitor her fingersticks 4  times daily (fasting and 2 hours after each meal).  She was advised that our goals for her fingerstick values are  fasting values of 90-95 or less and two-hour postprandial  values of 120 or less.  Should the majority (greater than 50%) of her fingerstick  results be above these values, she may have to be started on   insulin or metformin to help her achieve better glycemic  control.  The patient was advised that getting her fingerstick values as  close to these goals as possible would provide her with the  most optimal obstetrical outcome.  The increased risk of polyhydramnios, fetal macrosomia, and  preeclampsia associated with diabetes was also  discussed.  We will continue to follow her with monthly growth  ultrasounds.  Weekly fetal testing should be started at 32 weeks should  she require insulin or metformin for treatment.  Delivery for well-controlled diabetes in pregnancy is usually  recommended at around 39 weeks.  Delivery at 37 weeks may be considered should her glycemic  control be poor.  A follow-up exam to complete the views of the fetal anatomy  and another transvaginal cervical length measurement was  scheduled in 3 weeks.  The patient stated that all of her questions were answered  today.  A total of 30 minutes was spent counseling and coordinating  the care for this patient.  Greater than 50% of the time was  spent in direct face-to-face contact. ----------------------------------------------------------------------                   Steffan Keys, MD Electronically Signed Final Report   06/11/2024 07:30 am ----------------------------------------------------------------------        Assessment and Plan:  Pregnancy: G3P1101 at [redacted]w[redacted]d  1. Supervision of high risk pregnancy, antepartum (Primary)  2. Diet controlled gestational diabetes mellitus (GDM), antepartum - noncompliant with management of glucose.  Not checking CBG;s. Rx: - Metformin XR, 1000 mg po bid started today  3. History of cervical incompetence - good cervical length on U/S ( 3.4 cm ), and no funneling.  4. Premature cervical dilation in second trimester - no cervical dilation on ultrasound  5. History of preterm delivery, currently pregnant-fetal demise - low risk for preterm birth so far  6. Maternal  obesity syndrome, antepartum   Preterm labor symptoms and general obstetric precautions including but not limited to vaginal bleeding, contractions, leaking of fluid and fetal movement were reviewed in detail with the patient. Please refer to After Visit Summary for other counseling recommendations.   Return in about 4 weeks (around 07/10/2024) for [facility].   Rudy Carlin LABOR, MD 06/12/2024

## 2024-06-12 NOTE — Progress Notes (Signed)
 Pt presents for rob. Pt has no questions or concerns at this time.

## 2024-07-04 ENCOUNTER — Ambulatory Visit (HOSPITAL_BASED_OUTPATIENT_CLINIC_OR_DEPARTMENT_OTHER): Admitting: Maternal & Fetal Medicine

## 2024-07-04 ENCOUNTER — Ambulatory Visit: Attending: Obstetrics and Gynecology

## 2024-07-04 VITALS — BP 122/56 | HR 81

## 2024-07-04 DIAGNOSIS — O43192 Other malformation of placenta, second trimester: Secondary | ICD-10-CM | POA: Diagnosis not present

## 2024-07-04 DIAGNOSIS — E669 Obesity, unspecified: Secondary | ICD-10-CM

## 2024-07-04 DIAGNOSIS — O24410 Gestational diabetes mellitus in pregnancy, diet controlled: Secondary | ICD-10-CM

## 2024-07-04 DIAGNOSIS — Z3A23 23 weeks gestation of pregnancy: Secondary | ICD-10-CM | POA: Diagnosis present

## 2024-07-04 DIAGNOSIS — O0990 Supervision of high risk pregnancy, unspecified, unspecified trimester: Secondary | ICD-10-CM | POA: Diagnosis present

## 2024-07-04 DIAGNOSIS — O3662X0 Maternal care for excessive fetal growth, second trimester, not applicable or unspecified: Secondary | ICD-10-CM

## 2024-07-04 DIAGNOSIS — O99212 Obesity complicating pregnancy, second trimester: Secondary | ICD-10-CM | POA: Diagnosis present

## 2024-07-04 DIAGNOSIS — O09899 Supervision of other high risk pregnancies, unspecified trimester: Secondary | ICD-10-CM | POA: Diagnosis present

## 2024-07-04 DIAGNOSIS — O99210 Obesity complicating pregnancy, unspecified trimester: Secondary | ICD-10-CM | POA: Diagnosis present

## 2024-07-04 NOTE — Progress Notes (Signed)
   Patient information  Patient Name: Melanie Ball  Patient MRN:   [id number]  Referring practice: [facility]  Problem List   Patient Active Problem List   Diagnosis Date Noted   Gestational diabetes, diet controlled 05/06/2024   Maternal obesity syndrome, antepartum 05/05/2024   History of preterm delivery, currently pregnant 04/11/2024   Supervision of high risk pregnancy, antepartum 03/10/2024   Obesity 02/09/2020   Maternal Fetal medicine Consult  Melanie Ball is a 33 y.o. G3P1101 at [redacted]w[redacted]d here for ultrasound and consultation. Melanie Ball is doing well today with no acute concerns. Today we focused on the following:   Patient is here for growth ultrasound and a cervical length due to her history of a midtrimester loss.  Her OB provider recommended that she obtain a history indicated cerclage.  She saw MFM and a cerclage and progesterone were discussed vs serial CL. She had elected for serial CL. The cervical length is normal today and she has no signs or symptoms of PTB.  Preterm delivery precautions discussed.   The patient also has GDM that is diet controlled.  I encouraged her to eat a diabetic diet and exercise regularly.  Follow-up in 4 weeks as scheduled.  The patient had time to ask questions that were answered to her satisfaction.  She verbalized understanding and agrees to proceed with the plan below.  Recommendations - Preterm delivery precautions discussed.  Follow-up in 4 weeks as scheduled.  Review of Systems: A review of systems was performed and was negative except per HPI   Vitals and Physical Exam    07/04/2024   11:16 AM 06/12/2024   11:39 AM 06/10/2024    3:00 PM  Vitals with BMI  Weight  264 lbs 3 oz   Systolic 122 109 894  Diastolic 56 63 49  Pulse 81 81     Sitting comfortably on the sonogram table Nonlabored breathing Normal rate and rhythm Abdomen is nontender  Past pregnancies OB History  Gravida Para Term Preterm AB Living   3 2 1 1  0 1  SAB IAB Ectopic Multiple Live Births     0 1    # Outcome Date GA Lbr Len/2nd Weight Sex Type Anes PTL Lv  3 Current           2 Preterm 08/28/20 [redacted]w[redacted]d    Vag-Spont   FD  1 Term 02/09/20 [redacted]w[redacted]d 15:04 / 02:32 8 lb 8.5 oz (3.87 kg) M Vag-Spont EPI  LIV     I spent 20 minutes reviewing the patients chart, including labs and images as well as counseling the patient about her medical conditions. Greater than 50% of the time was spent in direct face-to-face patient counseling.  Melanie Ball  MFM, [facility]   07/04/2024  2:48 PM

## 2024-07-04 NOTE — Progress Notes (Unsigned)
 N/a

## 2024-07-10 ENCOUNTER — Encounter: Admitting: Obstetrics and Gynecology

## 2024-07-11 ENCOUNTER — Encounter: Payer: Self-pay | Admitting: Obstetrics

## 2024-07-11 ENCOUNTER — Ambulatory Visit: Admitting: Obstetrics

## 2024-07-11 VITALS — BP 123/72 | HR 89 | Wt 269.0 lb

## 2024-07-11 DIAGNOSIS — O0990 Supervision of high risk pregnancy, unspecified, unspecified trimester: Secondary | ICD-10-CM

## 2024-07-11 DIAGNOSIS — O09899 Supervision of other high risk pregnancies, unspecified trimester: Secondary | ICD-10-CM | POA: Diagnosis not present

## 2024-07-11 DIAGNOSIS — O24415 Gestational diabetes mellitus in pregnancy, controlled by oral hypoglycemic drugs: Secondary | ICD-10-CM | POA: Diagnosis not present

## 2024-07-11 DIAGNOSIS — O3432 Maternal care for cervical incompetence, second trimester: Secondary | ICD-10-CM | POA: Diagnosis not present

## 2024-07-11 DIAGNOSIS — O99210 Obesity complicating pregnancy, unspecified trimester: Secondary | ICD-10-CM

## 2024-07-11 NOTE — Progress Notes (Signed)
 Subjective:  Melanie Ball is a 33 y.o. G3P1101 at [redacted]w[redacted]d being seen today for ongoing prenatal care.  She is currently monitored for the following issues for this high-risk pregnancy and has Obesity; Supervision of high risk pregnancy, antepartum; History of preterm delivery, currently pregnant; Maternal obesity syndrome, antepartum; and Gestational diabetes, diet controlled on their problem list.  Patient reports no complaints.  Contractions: Not present. Vag. Bleeding: None.  Movement: Present. Denies leaking of fluid.   The following portions of the patient's history were reviewed and updated as appropriate: allergies, current medications, past family history, past medical history, past social history, past surgical history and problem list. Problem list updated.  Objective:   Vitals:   07/11/24 1016  BP: 123/72  Pulse: 89  Weight: 269 lb (122 kg)    Fetal Status:     Movement: Present     General:  Alert, oriented and cooperative. Patient is in no acute distress.  Skin: Skin is warm and dry. No rash noted.   Cardiovascular: Normal heart rate noted  Respiratory: Normal respiratory effort, no problems with respiration noted  Abdomen: Soft, gravid, appropriate for gestational age. Pain/Pressure: Absent     Pelvic:  Cervical exam deferred        Extremities: Normal range of motion.  Edema: None  Mental Status: Normal mood and affect. Normal behavior. Normal judgment and thought content.   Urinalysis:      Assessment and Plan:  Pregnancy: G3P1101 at [redacted]w[redacted]d  1. Supervision of high risk pregnancy, antepartum (Primary)  2. Incompetent cervix during second trimester, antepartum  3. History of preterm delivery, currently pregnant  4. Oral hypoglycemic controlled White classification A2 gestational diabetes mellitus (GDM) - poor glucose control, noncompliant:  FBS > 100   and 2 hour PP 120-150 - Metformin  increased to 1 gram twice a day with meals  5. Maternal obesity syndrome,  antepartum   Preterm labor symptoms and general obstetric precautions including but not limited to vaginal bleeding, contractions, leaking of fluid and fetal movement were reviewed in detail with the patient. Please refer to After Visit Summary for other counseling recommendations.   Return in about 4 weeks (around 08/08/2024) for [facility].   Rudy Carlin LABOR, MD 07/11/2024 .c

## 2024-07-11 NOTE — Progress Notes (Signed)
 Pt presents for rob. Pt has no questions or concerns at this time.

## 2024-07-29 ENCOUNTER — Ambulatory Visit

## 2024-07-29 ENCOUNTER — Ambulatory Visit: Attending: Obstetrics and Gynecology

## 2024-07-29 VITALS — BP 105/47

## 2024-07-29 DIAGNOSIS — O24415 Gestational diabetes mellitus in pregnancy, controlled by oral hypoglycemic drugs: Secondary | ICD-10-CM

## 2024-07-29 DIAGNOSIS — O99210 Obesity complicating pregnancy, unspecified trimester: Secondary | ICD-10-CM

## 2024-07-29 DIAGNOSIS — O99212 Obesity complicating pregnancy, second trimester: Secondary | ICD-10-CM

## 2024-07-29 DIAGNOSIS — O0990 Supervision of high risk pregnancy, unspecified, unspecified trimester: Secondary | ICD-10-CM

## 2024-07-29 DIAGNOSIS — O24410 Gestational diabetes mellitus in pregnancy, diet controlled: Secondary | ICD-10-CM

## 2024-07-29 DIAGNOSIS — Z3A27 27 weeks gestation of pregnancy: Secondary | ICD-10-CM

## 2024-07-29 NOTE — Progress Notes (Signed)
 Patient information  Patient Name: Melanie Ball  Patient MRN:   [id number]  Referring practice: MFM Referring Provider: [place] - [facility]  Problem List   Patient Active Problem List   Diagnosis Date Noted   Gestational diabetes mellitus (GDM) controlled on oral hypoglycemic drug, antepartum 05/06/2024   Maternal obesity syndrome, antepartum 05/05/2024   History of preterm delivery, currently pregnant 04/11/2024   Supervision of high risk pregnancy, antepartum 03/10/2024   Obesity 02/09/2020   Maternal Fetal medicine Consult  Melanie Ball is a 33 y.o. G3P1101 at [redacted]w[redacted]d here for ultrasound and consultation. Melanie Ball is doing well today with no acute concerns. Today we focused on the following:   The patient presents today for a growth ultrasound due to gestational diabetes, previously diet controlled but now requiring metformin  1000 mg twice daily. She reports that work has been difficult, and she has not recorded her blood glucose values over the past week. By her recollection, fasting values have been approximately 100 mg/dL and 2-hour postprandial values approximately 140-160 mg/dL. These values do not reflect ideal glycemic control and correlate with today's ultrasound findings of a large for gestational age fetus at the 93rd percentile. We discussed the importance of consistently checking and recording blood sugars, improving dietary compliance, and initiating a regular exercise program consisting of approximately 30 minutes of walking daily. The goal of these interventions is to reduce transplacental glucose transfer contributing to fetal overgrowth and to decrease the risks of stillbirth, birth trauma, maternal lacerations, and cesarean delivery. We discussed that delivery will likely occur at 37 weeks' gestation or sooner if indicated based on ultrasound findings or persistently poor glycemic control despite medical therapy. If, after one week of strict diet,  exercise, and documented blood sugar monitoring, she remains above goal, she will require initiation of split-dosed, weight-based insulin in addition to metformin .  The patient will return in 5 weeks for a repeat growth ultrasound and biophysical profile. She should undergo weekly antenatal testing with her primary obstetric provider between growth scans.  Recommendations -BPP and Growth in 5 weeks and then again at 36 weeks.  -Weekly testing at [facility] provider to schedule  -Continue metformin  but if blood sugars are not at least controlled at least 75% of the time then insulin should be added -Nutrition counseling emphasizing portion control; preference for complex over refined carbohydrates; limiting sugar-sweetened beverages and desserts; ensuring adequate protein and fiber; and maintaining regular meal timing to improve glycemic control, with consideration of a formal dietitian referral--especially for BMI >=40 or if early glucose screening is abnormal or borderline. -Moderate aerobic exercise totaling ~150 minutes per week (e.g., walking, swimming, or stationary cycling 20-30 minutes most days) as long as there are no obstetric contraindications, with encouragement of daily low-impact activity to reduce insulin resistance, support blood pressure control, and modestly lower the risk of cesarean delivery and excessive gestational weight gain. -If EFW at term exceeds 5000 g (non-diabetic) or 4500 g (diabetic), discuss option of scheduled cesarean delivery per guideline thresholds. Otherwise, proceed with planned vaginal delivery with preparedness for shoulder dystocia maneuvers during labor. -Delivery at 37 weeks of control continues to be suboptimal  I spent 30 minutes reviewing the patients chart, including labs and images as well as counseling the patient about her medical conditions. Greater than 50% of the time was spent in direct face-to-face patient counseling.  Melanie Ball  MFM, [facility]   07/29/2024  10:39 AM   Review of Systems: A review of  systems was performed and was negative except per HPI   Vitals and Physical Exam    07/29/2024    9:15 AM 07/11/2024   10:16 AM 07/04/2024   11:16 AM  Vitals with BMI  Weight  269 lbs   Systolic 105 123 877  Diastolic 47 72 56  Pulse  89 81    Sitting comfortably on the sonogram table Nonlabored breathing Normal rate and rhythm Abdomen is nontender  Past pregnancies OB History  Gravida Para Term Preterm AB Living  3 2 1 1  0 1  SAB IAB Ectopic Multiple Live Births     0 1    # Outcome Date GA Lbr Len/2nd Weight Sex Type Anes PTL Lv  3 Current           2 Preterm 08/28/20 [redacted]w[redacted]d    Vag-Spont   FD  1 Term 02/09/20 [redacted]w[redacted]d 15:04 / 02:32 8 lb 8.5 oz (3.87 kg) M Vag-Spont EPI  LIV     Future Appointments  Date Time Provider Department Center  08/15/2024  8:15 AM [facility] None  08/15/2024  8:55 AM Rudy Carlin LABOR, MD [facility] None  09/02/2024 11:15 AM [facility] PROVIDER 1 [facility]  09/02/2024 11:30 AM [facility] US2 [facility]

## 2024-08-01 ENCOUNTER — Ambulatory Visit: Admitting: *Deleted

## 2024-08-01 VITALS — BP 114/70 | HR 85 | Wt 270.4 lb

## 2024-08-01 DIAGNOSIS — O24415 Gestational diabetes mellitus in pregnancy, controlled by oral hypoglycemic drugs: Secondary | ICD-10-CM | POA: Diagnosis not present

## 2024-08-01 DIAGNOSIS — O0990 Supervision of high risk pregnancy, unspecified, unspecified trimester: Secondary | ICD-10-CM

## 2024-08-01 DIAGNOSIS — Z3A27 27 weeks gestation of pregnancy: Secondary | ICD-10-CM

## 2024-08-01 NOTE — Progress Notes (Signed)
 Melanie Ball presents for NST per antenatal testing guidelines/ MFM recommendations. Melanie Ball has a hx of PTD at [redacted] wks GA w/ fetal demise. She was also DX w/ GDM following an early GTT in response to Hgb A1C=6.0. She missed one of her scheduled prenatal visits but has been seen by MFM in the interim. Her next provider visit is in 2 weeks. I consulted with Joesph Sear, PA to determine if a provider visit was needed as an add on today. She advised for the pt to continue monitoring blood glucose and diet closely and increase activity as previously advised by MFM. Plan will be for the pt to return for weekly NST next week and see a provider as scheduled on 08/15/24. Plan of care and need for close blood sugar and diet monitoring and increase in activity reviewed with pt. Risks of poorly controlled blood sugar in pregnancy reviewed. Pt verbalized understanding. She will schedule her NST next week as soon as she gets her work schedule. Her NST today is reactive and reassuring. See documentation on Fetal Testing.

## 2024-08-06 ENCOUNTER — Encounter: Admitting: Obstetrics

## 2024-08-08 ENCOUNTER — Encounter: Payer: Self-pay | Admitting: Obstetrics and Gynecology

## 2024-08-08 ENCOUNTER — Other Ambulatory Visit: Payer: Self-pay

## 2024-08-15 ENCOUNTER — Other Ambulatory Visit

## 2024-08-15 ENCOUNTER — Encounter: Admitting: Obstetrics

## 2024-08-20 ENCOUNTER — Telehealth: Payer: Self-pay

## 2024-08-20 NOTE — Telephone Encounter (Signed)
 Attempted to return call about safe med list for cold/flu related symptoms. No answer, left vm, sent mychart message with safe OTC meds

## 2024-08-26 ENCOUNTER — Encounter: Admitting: Advanced Practice Midwife

## 2024-09-02 ENCOUNTER — Ambulatory Visit: Admitting: Maternal & Fetal Medicine

## 2024-09-02 ENCOUNTER — Ambulatory Visit: Attending: Maternal & Fetal Medicine

## 2024-09-02 VITALS — BP 110/52

## 2024-09-02 DIAGNOSIS — O99210 Obesity complicating pregnancy, unspecified trimester: Secondary | ICD-10-CM | POA: Insufficient documentation

## 2024-09-02 DIAGNOSIS — O3663X0 Maternal care for excessive fetal growth, third trimester, not applicable or unspecified: Secondary | ICD-10-CM

## 2024-09-02 DIAGNOSIS — Z3A32 32 weeks gestation of pregnancy: Secondary | ICD-10-CM | POA: Insufficient documentation

## 2024-09-02 DIAGNOSIS — O24415 Gestational diabetes mellitus in pregnancy, controlled by oral hypoglycemic drugs: Secondary | ICD-10-CM | POA: Insufficient documentation

## 2024-09-02 DIAGNOSIS — O0990 Supervision of high risk pregnancy, unspecified, unspecified trimester: Secondary | ICD-10-CM

## 2024-09-02 DIAGNOSIS — O43193 Other malformation of placenta, third trimester: Secondary | ICD-10-CM

## 2024-09-02 DIAGNOSIS — O99213 Obesity complicating pregnancy, third trimester: Secondary | ICD-10-CM | POA: Diagnosis not present

## 2024-09-02 DIAGNOSIS — E669 Obesity, unspecified: Secondary | ICD-10-CM

## 2024-09-02 NOTE — Progress Notes (Signed)
 Patient reports that she checks blood glucose at home and readings are as follows: Fasting levels range from 60 to 116, and two hour post-meal reading ranges from 94.to 130.

## 2024-09-02 NOTE — Progress Notes (Signed)
 "  Patient information  Patient Name: Melanie Ball  Patient MRN:   [id number]  Referring practice: MFM Referring Provider: [place] [facility]  Problem List   Patient Active Problem List   Diagnosis Date Noted   Gestational diabetes mellitus (GDM) controlled on oral hypoglycemic drug, antepartum 05/06/2024   Maternal obesity syndrome, antepartum 05/05/2024   History of preterm delivery, currently pregnant 04/11/2024   Supervision of high risk pregnancy, antepartum 03/10/2024   Obesity 02/09/2020    Maternal Fetal medicine Consult  Melanie Ball is a 34 y.o. G3P1101 at [redacted]w[redacted]d here for ultrasound and consultation. Melanie Ball is doing well today with no acute concerns. Today we focused on the following:   The patient is here for a growth ultrasound and biophysical profile due to gestational diabetes managed with metformin , single umbilical artery, marginal umbilical cord insertion, and elevated maternal BMI. On todays examination, the estimated fetal weight is at the 89th percentile with normal amniotic fluid volume. The biophysical profile is 8/8. She reports good fetal movement.  We discussed the importance of continued antenatal fetal surveillance and serial growth ultrasounds. Delivery around 37-[redacted] weeks gestation is reasonable as long as there is no evidence of a large-for-gestational-age fetus or other maternal or fetal complications. We reviewed glycemic control, and she reports that most fasting blood glucose values are less than 95 mg/dL and the majority of two-hour postprandial values are less than 120 mg/dL, which is reassuring.  She plans to complete her nonstress tests at her OB providers office but may also have testing performed here if needed. Given her history of a prior stillbirth around [redacted] weeks gestation, close surveillance and planned early-term delivery are reasonable. She will return in four weeks for repeat growth ultrasound and biophysical  profile.  RECOMMENDATIONS -Continue antenatal fetal surveillance with regular nonstress testing -Continue Metformin  at the current dose -Diet and exercise emphasized -Repeat growth ultrasound and biophysical profile in four weeks -Continue metformin  therapy with ongoing home blood glucose monitoring -Maintain glycemic targets (fasting <95 mg/dL, 2-hour postprandial <879 mg/dL) -Plan for delivery around 37-[redacted] weeks gestation  -Continue routine prenatal care with OB provider, with option for testing at MFM as needed   The patient had time to ask questions that were answered to her satisfaction.  She verbalized understanding and agrees to proceed with the plan above.   I spent 30 minutes reviewing the patients chart, including labs and images as well as counseling the patient about her medical conditions. Greater than 50% of the time was spent in direct face-to-face patient counseling.  Melanie Ball  MFM, [place]   09/02/2024  12:04 PM   Review of Systems: A review of systems was performed and was negative except per HPI   Vitals and Physical Exam    09/02/2024   10:11 AM 08/01/2024    8:33 AM 07/29/2024    9:15 AM  Vitals with BMI  Weight  270 lbs 6 oz   Systolic 110 114 894  Diastolic 52 70 47  Pulse  85     Sitting comfortably on the sonogram table Nonlabored breathing Normal rate and rhythm Abdomen is nontender  Past pregnancies OB History  Gravida Para Term Preterm AB Living  3 2 1 1  0 1  SAB IAB Ectopic Multiple Live Births     0 1    # Outcome Date GA Lbr Len/2nd Weight Sex Type Anes PTL Lv  3 Current           2  Preterm 08/28/20 [redacted]w[redacted]d    Vag-Spont   FD  1 Term 02/09/20 [redacted]w[redacted]d 15:04 / 02:32 8 lb 8.5 oz (3.87 kg) M Vag-Spont EPI  LIV     No future appointments.    "

## 2024-09-10 ENCOUNTER — Ambulatory Visit: Payer: Self-pay | Admitting: Obstetrics & Gynecology

## 2024-09-10 VITALS — BP 109/73 | HR 83 | Wt 269.8 lb

## 2024-09-10 DIAGNOSIS — O0990 Supervision of high risk pregnancy, unspecified, unspecified trimester: Secondary | ICD-10-CM

## 2024-09-10 DIAGNOSIS — O09899 Supervision of other high risk pregnancies, unspecified trimester: Secondary | ICD-10-CM

## 2024-09-10 DIAGNOSIS — O24415 Gestational diabetes mellitus in pregnancy, controlled by oral hypoglycemic drugs: Secondary | ICD-10-CM | POA: Diagnosis not present

## 2024-09-10 DIAGNOSIS — O09893 Supervision of other high risk pregnancies, third trimester: Secondary | ICD-10-CM | POA: Diagnosis not present

## 2024-09-10 DIAGNOSIS — O99213 Obesity complicating pregnancy, third trimester: Secondary | ICD-10-CM

## 2024-09-10 DIAGNOSIS — Z3A33 33 weeks gestation of pregnancy: Secondary | ICD-10-CM | POA: Diagnosis not present

## 2024-09-10 DIAGNOSIS — O0993 Supervision of high risk pregnancy, unspecified, third trimester: Secondary | ICD-10-CM | POA: Diagnosis not present

## 2024-09-10 DIAGNOSIS — O99210 Obesity complicating pregnancy, unspecified trimester: Secondary | ICD-10-CM

## 2024-09-10 NOTE — Progress Notes (Signed)
 "  PRENATAL VISIT NOTE  Subjective:  Melanie Ball is a 34 y.o. G3P1101 at [redacted]w[redacted]d being seen today for ongoing prenatal care.  She is currently monitored for the following issues for this high-risk pregnancy and has Obesity; Supervision of high risk pregnancy, antepartum; History of preterm delivery, currently pregnant; Maternal obesity syndrome, antepartum; and Gestational diabetes mellitus (GDM) controlled on oral hypoglycemic drug, antepartum on their problem list.  Patient reports no complaints.  Contractions: Not present. Vag. Bleeding: None.  Movement: Present. Denies leaking of fluid.   The following portions of the patient's history were reviewed and updated as appropriate: allergies, current medications, past family history, past medical history, past social history, past surgical history and problem list.   Objective:   Vitals:   09/10/24 0949  BP: 109/73  Pulse: 83  Weight: 269 lb 12.8 oz (122.4 kg)    Fetal Status:  Fetal Heart Rate (bpm): 152   Movement: Present    General: Alert, oriented and cooperative. Patient is in no acute distress.  Skin: Skin is warm and dry. No rash noted.   Cardiovascular: Normal heart rate noted  Respiratory: Normal respiratory effort, no problems with respiration noted  Abdomen: Soft, gravid, appropriate for gestational age.  Pain/Pressure: Present (pressure)     Pelvic: Cervical exam deferred        Extremities: Normal range of motion.  Edema: None  Mental Status: Normal mood and affect. Normal behavior. Normal judgment and thought content.      04/07/2024    9:41 AM 03/10/2024    9:38 AM 05/15/2023   10:09 AM  Depression screen PHQ 2/9  Decreased Interest 1 0 1  Down, Depressed, Hopeless 1 0 1  PHQ - 2 Score 2 0 2  Altered sleeping 1 0 1  Tired, decreased energy 1 0 1  Change in appetite 1 0 1  Feeling bad or failure about yourself  1 0 1  Trouble concentrating 3 1 1   Moving slowly or fidgety/restless 3 1 1   Suicidal thoughts 0  0 0  PHQ-9 Score 12  2  8       Data saved with a previous flowsheet row definition        04/07/2024    9:41 AM 03/10/2024    9:39 AM 05/15/2023   10:09 AM 07/09/2020   10:41 AM  GAD 7 : Generalized Anxiety Score  Nervous, Anxious, on Edge 3  1  1   0   Control/stop worrying 1  1  1   0   Worry too much - different things 1  1  1   0   Trouble relaxing 1  0  1  0   Restless 3  0  1  0   Easily annoyed or irritable 3  1  1   0   Afraid - awful might happen 0  0  1  0   Total GAD 7 Score 12 4 7  0     Data saved with a previous flowsheet row definition    Assessment and Plan:  Pregnancy: G3P1101 at [redacted]w[redacted]d 1. Supervision of high risk pregnancy, antepartum (Primary)   2. [redacted] weeks gestation of pregnancy NST reactive  3. Gestational diabetes mellitus (GDM) controlled on oral hypoglycemic drug, antepartum   Media Information  Document Information  Photographic Image: Photos    09/10/2024 09:58  Attached To:  Routine Prenatal on 09/10/24 with Eveline Lynwood MATSU, MD  Source Information  Eveline Lynwood MATSU, MD  [facility]  80% in range for now 4. Maternal obesity syndrome, antepartum   5. History of preterm delivery, currently pregnant   Preterm labor symptoms and general obstetric precautions including but not limited to vaginal bleeding, contractions, leaking of fluid and fetal movement were reviewed in detail with the patient. Please refer to After Visit Summary for other counseling recommendations.   Return in about 1 week (around 09/17/2024).  Future Appointments  Date Time Provider Department Center  09/24/2024 10:15 AM Rudy Carlin LABOR, MD [facility] None  10/01/2024  7:15 AM [facility] PROVIDER 1 [facility]  10/01/2024  7:30 AM [facility] US3 [facility]    Lynwood Solomons, MD "

## 2024-09-17 ENCOUNTER — Encounter: Payer: Self-pay | Admitting: Obstetrics

## 2024-09-18 ENCOUNTER — Encounter (HOSPITAL_COMMUNITY): Payer: Self-pay | Admitting: Obstetrics and Gynecology

## 2024-09-18 ENCOUNTER — Inpatient Hospital Stay (HOSPITAL_COMMUNITY)

## 2024-09-18 ENCOUNTER — Inpatient Hospital Stay (HOSPITAL_COMMUNITY)
Admission: AD | Admit: 2024-09-18 | Discharge: 2024-09-18 | Disposition: A | Discharge status: Home / Self Care | Attending: Obstetrics and Gynecology | Admitting: Obstetrics and Gynecology

## 2024-09-18 ENCOUNTER — Other Ambulatory Visit: Payer: Self-pay

## 2024-09-18 DIAGNOSIS — Z87891 Personal history of nicotine dependence: Secondary | ICD-10-CM | POA: Diagnosis not present

## 2024-09-18 DIAGNOSIS — W19XXXA Unspecified fall, initial encounter: Secondary | ICD-10-CM

## 2024-09-18 DIAGNOSIS — O09213 Supervision of pregnancy with history of pre-term labor, third trimester: Secondary | ICD-10-CM | POA: Diagnosis not present

## 2024-09-18 DIAGNOSIS — O26893 Other specified pregnancy related conditions, third trimester: Secondary | ICD-10-CM | POA: Diagnosis not present

## 2024-09-18 DIAGNOSIS — O99213 Obesity complicating pregnancy, third trimester: Secondary | ICD-10-CM | POA: Diagnosis not present

## 2024-09-18 DIAGNOSIS — Y9301 Activity, walking, marching and hiking: Secondary | ICD-10-CM | POA: Diagnosis not present

## 2024-09-18 DIAGNOSIS — W109XXA Fall (on) (from) unspecified stairs and steps, initial encounter: Secondary | ICD-10-CM | POA: Diagnosis not present

## 2024-09-18 DIAGNOSIS — R109 Unspecified abdominal pain: Secondary | ICD-10-CM

## 2024-09-18 DIAGNOSIS — S3991XA Unspecified injury of abdomen, initial encounter: Secondary | ICD-10-CM | POA: Diagnosis present

## 2024-09-18 DIAGNOSIS — O9A213 Injury, poisoning and certain other consequences of external causes complicating pregnancy, third trimester: Secondary | ICD-10-CM

## 2024-09-18 DIAGNOSIS — Z3A34 34 weeks gestation of pregnancy: Secondary | ICD-10-CM | POA: Insufficient documentation

## 2024-09-18 DIAGNOSIS — O3433 Maternal care for cervical incompetence, third trimester: Secondary | ICD-10-CM | POA: Insufficient documentation

## 2024-09-18 DIAGNOSIS — O99820 Streptococcus B carrier state complicating pregnancy: Secondary | ICD-10-CM | POA: Diagnosis not present

## 2024-09-18 DIAGNOSIS — R1030 Lower abdominal pain, unspecified: Secondary | ICD-10-CM | POA: Insufficient documentation

## 2024-09-18 DIAGNOSIS — O24415 Gestational diabetes mellitus in pregnancy, controlled by oral hypoglycemic drugs: Secondary | ICD-10-CM | POA: Diagnosis not present

## 2024-09-18 LAB — CBC WITH DIFFERENTIAL/PLATELET
Abs Immature Granulocytes: 0.02 10*3/uL (ref 0.00–0.07)
Basophils Absolute: 0 10*3/uL (ref 0.0–0.1)
Basophils Relative: 1 %
Eosinophils Absolute: 0 10*3/uL (ref 0.0–0.5)
Eosinophils Relative: 1 %
HCT: 33.6 % — ABNORMAL LOW (ref 36.0–46.0)
Hemoglobin: 11.5 g/dL — ABNORMAL LOW (ref 12.0–15.0)
Immature Granulocytes: 0 %
Lymphocytes Relative: 18 %
Lymphs Abs: 1.1 10*3/uL (ref 0.7–4.0)
MCH: 31.6 pg (ref 26.0–34.0)
MCHC: 34.2 g/dL (ref 30.0–36.0)
MCV: 92.3 fL (ref 80.0–100.0)
Monocytes Absolute: 0.5 10*3/uL (ref 0.1–1.0)
Monocytes Relative: 8 %
Neutro Abs: 4.5 10*3/uL (ref 1.7–7.7)
Neutrophils Relative %: 72 %
Platelets: 255 10*3/uL (ref 150–400)
RBC: 3.64 MIL/uL — ABNORMAL LOW (ref 3.87–5.11)
RDW: 14.3 % (ref 11.5–15.5)
WBC: 6.2 10*3/uL (ref 4.0–10.5)
nRBC: 0 % (ref 0.0–0.2)

## 2024-09-18 LAB — KLEIHAUER-BETKE STAIN
# Vials RhIg: 1
Fetal Cells %: 0 %
Quantitation Fetal Hemoglobin: 0 mL

## 2024-09-18 NOTE — Discharge Instructions (Signed)
 Your ultrasound, labs, and fetal monitoring today were reassuring that your baby is doing well, and your placenta is normal!  Reasons to return to [facility] at [facility]: Less than 36 weeks: Contractions feels like menstrual cramps. You should go to the hospital if you have more than 6 contractions in an hour, even after you have rested and drank at least 16 ounces of water.  More than 36 weeks: You begin to have strong, frequent contractions 5 minutes apart or less, each last 1 minute, these have been going on for 1-2 hours, and you cannot walk or talk during them. Your water breaks.  Sometimes it is a big gush of fluid. However, many times it may it may be much more subtle. You should go to the hospital if you have a constant leakage of fluid from your vagina, enough to soak a pad when you are walking around.  You have vaginal bleeding.  It is normal to have a small amount of spotting if your cervix was checked. If you have bleeding requiring the use of a pad, go to the hospital. You don't feel your baby moving like normal.  If you think that you babys movement is decreased, eat a snack and rest on your left side in a quiet room for one hour. If you have not felt the baby move more than 6 times in an hour GO TO THE HOSPITAL.

## 2024-09-18 NOTE — MAU Provider Note (Signed)
 Chief Complaint:  Fall and Abdominal Pain   HPI   None     Melanie Ball is a 34 y.o. G3P1101 at [redacted]w[redacted]d who presents to maternity admissions reporting a fall with direct abdominal trauma.  She reports that she was going down the stairs at work her hands and fell at about 0850.  She landed on her hands and her abdomen.  She reports that she developed lower abdominal pain after the fall, pain currently rated at about 4/10 which is down from 6/10 in triage.  Denies vaginal bleeding, leaking of fluid, regular contractions.  She endorses good fetal movement.  Pregnancy Course: Receives care at [facility] for [facility] . Prenatal records reviewed.  Pregnancy complicated by gestational diabetes on metformin , obesity, history of preterm delivery (IUFD at 20 weeks).  Past Medical History:  Diagnosis Date   Anxiety    BMI 40.0-44.9, adult (HCC)    Chlamydia infection affecting pregnancy in second trimester 08/16/2020   Depression    Group B Streptococcus urinary tract infection affecting pregnancy in second trimester 08/16/2020   Less than 10,000 colonies, needs prophylaxis in labor    History of cervical incompetence 12/07/2020   08/27/2020: at 19-20wks.deliverd on 1/8. Dx at anatomy u/s with hour glassing membranes. Had term, uncomplicated SVD 5 months prior           History of COVID-19 08/16/2020   +COVID at [facility] 09/11/19   Premature cervical dilation in second trimester 08/27/2020   Preterm delivery    OB History  Gravida Para Term Preterm AB Living  3 2 1 1  0 1  SAB IAB Ectopic Multiple Live Births     0 1    # Outcome Date GA Lbr Len/2nd Weight Sex Type Anes PTL Lv  3 Current           2 Preterm 08/28/20 [redacted]w[redacted]d    Vag-Spont   FD  1 Term 02/09/20 [redacted]w[redacted]d 15:04 / 02:32 3870 g M Vag-Spont EPI  LIV   Past Surgical History:  Procedure Laterality Date   NO PAST SURGERIES     Family History  Problem Relation Age of Onset   Diabetes Maternal Grandmother    Asthma Neg Hx     Cancer Neg Hx    Heart disease Neg Hx    Hypertension Neg Hx    Social History[1] Allergies[2] Medications Prior to Admission  Medication Sig Dispense Refill Last Dose/Taking   Doxylamine -Pyridoxine  (DICLEGIS ) 10-10 MG TBEC Take 2 tabs at bedtime. If needed, add another tab in the morning. If needed, add another tab in the afternoon, up to 4 tabs/day. 100 tablet 5 09/17/2024   metFORMIN  (GLUCOPHAGE -XR) 500 MG 24 hr tablet Take 2 tablets (1,000 mg total) by mouth 2 (two) times daily with a meal. 120 tablet 5 09/17/2024   Prenatal 28-0.8 MG TABS Take 1 tablet by mouth daily. 30 tablet 12 Past Week   Accu-Chek Softclix Lancets lancets Check blood sugars four times daily; when waking up, and 2 hours after breakfast, lunch, and dinner. 100 each 12    aspirin  EC 81 MG tablet Take 1 tablet (81 mg total) by mouth daily. Take after 12 weeks for prevention of preeclampsia later in pregnancy (Patient not taking: Reported on 09/10/2024) 300 tablet 2    Blood Glucose Monitoring Suppl (ACCU-CHEK GUIDE) w/Device KIT 1 Device by Does not apply route 4 (four) times daily. Check blood sugars four times daily; when waking up, and 2 hours after breakfast, lunch, and  dinner. 1 kit 0    cyclobenzaprine  (FLEXERIL ) 5 MG tablet Take 1 tablet (5 mg total) by mouth 3 (three) times daily as needed for muscle spasms. 30 tablet 0 More than a month   glucose blood (ACCU-CHEK GUIDE TEST) test strip Check blood sugars four times daily; when waking up, and 2 hours after breakfast, lunch, and dinner. 100 each 12    I have reviewed patient's Past Medical Hx, Surgical Hx, Family Hx, Social Hx, medications and allergies.   ROS  Pertinent items noted in HPI and remainder of comprehensive ROS otherwise negative.   PHYSICAL EXAM  Patient Vitals for the past 24 hrs:  BP Pulse Resp SpO2  09/18/24 1018 (!) 124/57 84 19 100 %    Constitutional: Well-developed, well-nourished female in no acute distress.  HEENT: atraumatic,  normocephalic. Neck has normal ROM. EOM intact. Cardiovascular: normal rate & rhythm, warm and well-perfused Respiratory: normal effort, no problems with respiration noted GI: Abd soft, non-tender, non-distended, no bruising MSK: Extremities nontender, no edema, normal ROM Skin: warm and dry. Acyanotic, no jaundice or pallor. Neurologic: Alert and oriented x 4. No abnormal coordination. Psychiatric: Normal mood. Speech not slurred, not rapid/pressured. Patient is cooperative. GU: no CVA tenderness   Fetal Tracing: Baseline FHR: 155 per minute for majority of monitoring with periods of change in baseline to 145 per minute Fetal heart variability: moderate Fetal Heart Rate accelerations: yes Fetal Heart Rate decelerations: none Fetal Non-stress Test: Category I (reactive) Toco: no uterine contractions   Labs: Results for orders placed or performed during the hospital encounter of 09/18/24 (from the past 24 hours)  CBC with Differential     Status: Abnormal   Collection Time: 09/18/24 10:31 AM  Result Value Ref Range   WBC 6.2 4.0 - 10.5 K/uL   RBC 3.64 (L) 3.87 - 5.11 MIL/uL   Hemoglobin 11.5 (L) 12.0 - 15.0 g/dL   HCT 66.3 (L) 63.9 - 53.9 %   MCV 92.3 80.0 - 100.0 fL   MCH 31.6 26.0 - 34.0 pg   MCHC 34.2 30.0 - 36.0 g/dL   RDW 85.6 88.4 - 84.4 %   Platelets 255 150 - 400 K/uL   nRBC 0.0 0.0 - 0.2 %   Neutrophils Relative % 72 %   Neutro Abs 4.5 1.7 - 7.7 K/uL   Lymphocytes Relative 18 %   Lymphs Abs 1.1 0.7 - 4.0 K/uL   Monocytes Relative 8 %   Monocytes Absolute 0.5 0.1 - 1.0 K/uL   Eosinophils Relative 1 %   Eosinophils Absolute 0.0 0.0 - 0.5 K/uL   Basophils Relative 1 %   Basophils Absolute 0.0 0.0 - 0.1 K/uL   Immature Granulocytes 0 %   Abs Immature Granulocytes 0.02 0.00 - 0.07 K/uL  Kleihauer-Betke stain     Status: None   Collection Time: 09/18/24 10:31 AM  Result Value Ref Range   Fetal Cells % 0 %   Quantitation Fetal Hemoglobin 0 mL   # Vials RhIg 1     Imaging:  US  MFM OB LIMITED Result Date: 09/18/2024 ----------------------------------------------------------------------  OBSTETRICS REPORT                       (Signed Final 09/18/2024 01:57 pm) ---------------------------------------------------------------------- Patient Info  ID #:       [id number]                          D.O.B.:  Dec 29, 1990 (33 yrs)(F)  Name:       Melanie Ball            Visit Date: 09/18/2024 01:00 pm ---------------------------------------------------------------------- Performed By  Attending:        Steffan Keys MD         Ref. Address:     [facility]  Performed By:     Burnard LITTIE Custard          Secondary Phy.:   [facility] MAU/Triage                    RDMS  Referred By:      Joesph Sear PA      [facility] ---------------------------------------------------------------------- Orders  #  Description                           Code        Ordered By  1  US  MFM OB LIMITED                     76815.01    Cleburne Savini ----------------------------------------------------------------------  #  Order #                     Accession #                Episode #  1  [id number]                   [id number]                 [id number] ---------------------------------------------------------------------- Indications  Traumatic injury during pregnancy (fall)       O9A.219 T14.90  [redacted] weeks gestation of pregnancy                Z3A.34  Abdominal pain in pregnancy                    O99.89 ---------------------------------------------------------------------- Fetal Evaluation  Num Of Fetuses:         1  Fetal Heart Rate(bpm):  145  Cardiac Activity:       Observed  Presentation:           Cephalic  Placenta:               Posterior  P. Cord Insertion:      Previously seen  Amniotic Fluid  AFI FV:      Within normal limits  AFI Sum(cm)     %Tile       Largest Pocket(cm)  16.1            58          5.9  RUQ(cm)        RLQ(cm)       LUQ(cm)        LLQ(cm)  3.4           2.9           3.9  5.9  Comment:    No placental abruption or previa identified. ---------------------------------------------------------------------- OB History  Gravidity:    3         Term:   1        Prem:   1        SAB:   0  TOP:          0       Ectopic:  0        Living: 1 ---------------------------------------------------------------------- Gestational Age  LMP:           38w 2d        Date:  12/25/23                   EDD:   09/30/24  Best:          34w 2d     Det. By:  Early Ultrasound         EDD:   10/28/24                                      (03/10/24) ---------------------------------------------------------------------- Anatomy  Ventricles:            Appears normal         Stomach:                Appears normal, left                                                                        sided  Heart:                 Appears normal         Kidneys:                Appear normal                         (4CH, axis, and                         situs)  Diaphragm:             Appears normal         Bladder:                Appears normal ---------------------------------------------------------------------- Cervix Uterus Adnexa  Cervix  Not visualized (advanced GA >24wks)  Uterus  No abnormality visualized.  Right Ovary  Not visualized.  Left Ovary  Within normal limits.  Adnexa  No abnormality visualized ---------------------------------------------------------------------- Comments  This patient presented to the MAU following a fall.  A limited ultrasound performed today shows that the fetus is  in the vertex presentation.  There was normal amniotic fluid noted with a total AFI of 16.1  cm.  A normal-appearing posterior placenta was noted. ----------------------------------------------------------------------                   Steffan Keys, MD Electronically Signed Final Report   09/18/2024 01:57 pm  ----------------------------------------------------------------------    MDM & MAU COURSE  MDM: High  MAU Course: -Mild hypotension, on chart review  BP today within normal range for patient's baseline BP. Other vitals within normal limits. -CBC and Kleihauer-Betke stain given direct abdominal trauma. -US  for additional evaluation of placenta to rule out abruption. -Discussed minimum of 4 hours monitoring for direct abdominal trauma in pregnancy, patient verbalized understanding and agreement. -Discussed with Dr. Abigail. If no signs of placental abruption on US , labs, or ongoing/worsening abdominal pain then 4 hours monitoring reasonable (until 1400 today). If concerning findings, then admit for 24 hours observation. -CBC within normal limits for third trimester of pregnancy. -NST has been reactive. No uterine contractions. -US  with posterior placenta, no abruption or previa. Fluid levels are appropriate. -Kleihauer-Betke stain within normal limits. -NST new baseline 135 per minute after back from US , moderate variability, accelerations present, no decelerations, reactive. -No abdominal pain or tenderness after 4 hours. No evidence of placental abruption on exam, US , or lab evaluation. NST and US  reassuring of fetal well-being. No contractions or ongoing abdominal pain, so will discharge with strict preterm labor precautions. -Evaluation does not show pathology that would require ongoing emergent intervention or inpatient treatment. Patient is hemodynamically stable and mentating appropriately. Discussed findings and plan with patient, who agrees with care plan.  Orders Placed This Encounter  Procedures   US  MFM OB LIMITED   CBC with Differential   Kleihauer-Betke stain   Diet regular Room service appropriate? Yes; Fluid consistency: Thin   Discharge patient   No orders of the defined types were placed in this encounter.   ASSESSMENT   1. Fall, initial encounter   2. Blunt trauma to  abdomen, initial encounter   3. [redacted] weeks gestation of pregnancy     PLAN  Discharge home in stable condition with preterm labor precautions.  Follow up at [facility] as scheduled on 09/24/2024.    Follow-up Information     [facility] for [facility] Follow up.   Specialty: Obstetrics and Gynecology Why: As scheduled for ongoing prenatal care Contact information: [address] [place]  [address] [phone]                 Allergies as of 09/18/2024   No Known Allergies      Medication List     TAKE these medications    Accu-Chek Guide Test test strip Generic drug: glucose blood Check blood sugars four times daily; when waking up, and 2 hours after breakfast, lunch, and dinner.   Accu-Chek Guide w/Device Kit 1 Device by Does not apply route 4 (four) times daily. Check blood sugars four times daily; when waking up, and 2 hours after breakfast, lunch, and dinner.   Accu-Chek Softclix Lancets lancets Check blood sugars four times daily; when waking up, and 2 hours after breakfast, lunch, and dinner.   aspirin  EC 81 MG tablet Take 1 tablet (81 mg total) by mouth daily. Take after 12 weeks for prevention of preeclampsia later in pregnancy   cyclobenzaprine  5 MG tablet Commonly known as: FLEXERIL  Take 1 tablet (5 mg total) by mouth 3 (three) times daily as needed for muscle spasms.   Doxylamine -Pyridoxine  10-10 MG Tbec Commonly known as: Diclegis  Take 2 tabs at bedtime. If needed, add another tab in the morning. If needed, add another tab in the afternoon, up to 4 tabs/day.   metFORMIN  500 MG 24 hr tablet Commonly known as: GLUCOPHAGE -XR Take 2 tablets (1,000 mg total) by mouth 2 (two) times daily with a meal.   Prenatal 28-0.8 MG Tabs Take  1 tablet by mouth daily.        Joesph DELENA Sear, PA     [1]  Social History Tobacco Use   Smoking status: Former    Current packs/day: 0.00    Types:  Cigarettes    Quit date: 02/28/2019    Years since quitting: 5.5   Smokeless tobacco: Never  Vaping Use   Vaping status: Former  Substance Use Topics   Alcohol use: Not Currently    Comment: about 1x per weel   Drug use: Not Currently  [2] No Known Allergies

## 2024-09-18 NOTE — MAU Note (Addendum)
 Melanie Ball is a 34 y.o. at [redacted]w[redacted]d here in MAU reporting: falling going down stairs at work with hands full and landing on hands and belly, lower abd pain started after fall. She reports +FMs, Denies LOF, VB, regular contractions, blurry vision, headaches, peripheral edema, or RUQ pain.   LMP: -- Onset of complaint: after fall  Pain score: 6/10 Vitals:   09/18/24 1018  BP: (!) 124/57  Pulse: 84  Resp: 19  SpO2: 100%     FHT: 155  Lab orders placed from triage: none

## 2024-09-24 ENCOUNTER — Ambulatory Visit: Payer: Self-pay | Admitting: Obstetrics

## 2024-09-24 ENCOUNTER — Encounter: Payer: Self-pay | Admitting: Obstetrics

## 2024-09-24 VITALS — BP 121/75 | HR 84 | Wt 275.5 lb

## 2024-09-24 DIAGNOSIS — O09299 Supervision of pregnancy with other poor reproductive or obstetric history, unspecified trimester: Secondary | ICD-10-CM

## 2024-09-24 DIAGNOSIS — O09899 Supervision of other high risk pregnancies, unspecified trimester: Secondary | ICD-10-CM

## 2024-09-24 DIAGNOSIS — O0990 Supervision of high risk pregnancy, unspecified, unspecified trimester: Secondary | ICD-10-CM

## 2024-09-24 DIAGNOSIS — O99210 Obesity complicating pregnancy, unspecified trimester: Secondary | ICD-10-CM

## 2024-09-24 DIAGNOSIS — O24415 Gestational diabetes mellitus in pregnancy, controlled by oral hypoglycemic drugs: Secondary | ICD-10-CM

## 2024-09-24 DIAGNOSIS — K219 Gastro-esophageal reflux disease without esophagitis: Secondary | ICD-10-CM

## 2024-09-24 MED ORDER — PANTOPRAZOLE SODIUM 40 MG PO TBEC: 40.0000 mg | DELAYED_RELEASE_TABLET | Freq: Two times a day (BID) | ORAL | 5 refills | Status: AC

## 2024-09-24 NOTE — Progress Notes (Signed)
 Subjective:  Melanie Ball is a 34 y.o. G3P1101 at [redacted]w[redacted]d being seen today for ongoing prenatal care.  She is currently monitored for the following issues for this high-risk pregnancy and has Obesity; Supervision of high risk pregnancy, antepartum; History of preterm delivery, currently pregnant; Maternal obesity syndrome, antepartum; and Gestational diabetes mellitus (GDM) controlled on oral hypoglycemic drug, antepartum on their problem list.  Patient reports no complaints.   .  .   . Denies leaking of fluid.   The following portions of the patient's history were reviewed and updated as appropriate: allergies, current medications, past family history, past medical history, past social history, past surgical history and problem list. Problem list updated.  Objective:  There were no vitals filed for this visit.  Fetal Status:           General:  Alert, oriented and cooperative. Patient is in no acute distress.  Skin: Skin is warm and dry. No rash noted.   Cardiovascular: Normal heart rate noted  Respiratory: Normal respiratory effort, no problems with respiration noted  Abdomen: Soft, gravid, appropriate for gestational age.       Pelvic:  Cervical exam deferred        Extremities: Normal range of motion.     Mental Status: Normal mood and affect. Normal behavior. Normal judgment and thought content.   Urinalysis:      Assessment and Plan:  Pregnancy: G3P1101 at [redacted]w[redacted]d  1. Supervision of high risk pregnancy, antepartum (Primary)  2. H/O incompetent cervix, currently pregnant - clinically stable  3. History of preterm delivery, currently pregnant - clinically stable  4. Gestational diabetes mellitus (GDM) controlled on oral hypoglycemic drug, antepartum - noncompliant with self-glucose testing Rx: NST:  Reactive.  FHR 145 bpm.  Good variability.  15 x 15 accels, no decels  5. Maternal obesity syndrome, antepartum   Preterm labor symptoms and general obstetric precautions  including but not limited to vaginal bleeding, contractions, leaking of fluid and fetal movement were reviewed in detail with the patient. Please refer to After Visit Summary for other counseling recommendations.   Return in about 1 week (around 10/01/2024) for [facility].   Rudy Carlin DELENA, MD 09/24/2024

## 2024-09-24 NOTE — Progress Notes (Signed)
 Pt presents for rob and NST. Pt has no questions or concerns at this time.

## 2024-10-01 ENCOUNTER — Ambulatory Visit

## 2024-10-01 ENCOUNTER — Encounter: Payer: Self-pay | Admitting: Obstetrics & Gynecology

## 2024-10-28 ENCOUNTER — Inpatient Hospital Stay (HOSPITAL_COMMUNITY): Admit: 2024-10-28
